# Patient Record
Sex: Male | Born: 1940 | Hispanic: Yes | Smoking: Current every day smoker
Health system: Southern US, Community
[De-identification: ages and names within clinical notes are randomized; demographics above are authoritative.]

## PROBLEM LIST (undated history)

## (undated) DIAGNOSIS — F329 Major depressive disorder, single episode, unspecified: Secondary | ICD-10-CM

## (undated) DIAGNOSIS — F32A Depression, unspecified: Secondary | ICD-10-CM

## (undated) DIAGNOSIS — K2970 Gastritis, unspecified, without bleeding: Secondary | ICD-10-CM

## (undated) DIAGNOSIS — I10 Essential (primary) hypertension: Secondary | ICD-10-CM

## (undated) DIAGNOSIS — I252 Old myocardial infarction: Secondary | ICD-10-CM

## (undated) DIAGNOSIS — Z950 Presence of cardiac pacemaker: Secondary | ICD-10-CM

## (undated) DIAGNOSIS — R55 Syncope and collapse: Secondary | ICD-10-CM

## (undated) DIAGNOSIS — M1990 Unspecified osteoarthritis, unspecified site: Secondary | ICD-10-CM

## (undated) DIAGNOSIS — I442 Atrioventricular block, complete: Secondary | ICD-10-CM

## (undated) DIAGNOSIS — I240 Acute coronary thrombosis not resulting in myocardial infarction: Secondary | ICD-10-CM

## (undated) HISTORY — DX: Essential (primary) hypertension: I10

## (undated) HISTORY — DX: Presence of cardiac pacemaker: Z95.0

## (undated) HISTORY — DX: Atrioventricular block, complete: I44.2

## (undated) HISTORY — DX: Old myocardial infarction: I25.2

## (undated) HISTORY — DX: Depression, unspecified: F32.A

## (undated) HISTORY — DX: Gastritis, unspecified, without bleeding: K29.70

## (undated) HISTORY — DX: Acute coronary thrombosis not resulting in myocardial infarction: I24.0

## (undated) HISTORY — DX: Major depressive disorder, single episode, unspecified: F32.9

## (undated) HISTORY — DX: Syncope and collapse: R55

## (undated) HISTORY — PX: PACEMAKER INSERTION: SHX728

## (undated) HISTORY — DX: Unspecified osteoarthritis, unspecified site: M19.90

---

## 1990-08-18 DIAGNOSIS — I252 Old myocardial infarction: Secondary | ICD-10-CM

## 1990-08-18 HISTORY — DX: Old myocardial infarction: I25.2

## 2003-12-19 ENCOUNTER — Emergency Department (HOSPITAL_COMMUNITY): Admission: EM | Admit: 2003-12-19 | Discharge: 2003-12-20 | Payer: Self-pay | Admitting: Emergency Medicine

## 2003-12-20 ENCOUNTER — Emergency Department (HOSPITAL_COMMUNITY): Admission: EM | Admit: 2003-12-20 | Discharge: 2003-12-20 | Payer: Self-pay | Admitting: Emergency Medicine

## 2004-01-21 ENCOUNTER — Emergency Department (HOSPITAL_COMMUNITY): Admission: EM | Admit: 2004-01-21 | Discharge: 2004-01-21 | Payer: Self-pay | Admitting: Emergency Medicine

## 2004-01-22 ENCOUNTER — Ambulatory Visit (HOSPITAL_COMMUNITY): Admission: RE | Admit: 2004-01-22 | Discharge: 2004-01-22 | Payer: Self-pay | Admitting: *Deleted

## 2004-01-24 ENCOUNTER — Ambulatory Visit (HOSPITAL_COMMUNITY): Admission: RE | Admit: 2004-01-24 | Discharge: 2004-01-24 | Payer: Self-pay | Admitting: *Deleted

## 2004-02-08 ENCOUNTER — Ambulatory Visit (HOSPITAL_COMMUNITY): Admission: RE | Admit: 2004-02-08 | Discharge: 2004-02-08 | Payer: Self-pay | Admitting: *Deleted

## 2004-06-19 ENCOUNTER — Ambulatory Visit: Payer: Self-pay | Admitting: *Deleted

## 2004-09-18 ENCOUNTER — Ambulatory Visit: Payer: Self-pay | Admitting: Internal Medicine

## 2005-06-17 ENCOUNTER — Ambulatory Visit (HOSPITAL_COMMUNITY): Admission: RE | Admit: 2005-06-17 | Discharge: 2005-06-17 | Payer: Self-pay | Admitting: Gastroenterology

## 2005-07-04 ENCOUNTER — Ambulatory Visit: Payer: Self-pay | Admitting: Internal Medicine

## 2006-01-15 ENCOUNTER — Ambulatory Visit: Payer: Self-pay | Admitting: Internal Medicine

## 2006-08-04 ENCOUNTER — Ambulatory Visit: Payer: Self-pay | Admitting: Internal Medicine

## 2007-01-25 ENCOUNTER — Ambulatory Visit: Payer: Self-pay | Admitting: Internal Medicine

## 2007-03-16 ENCOUNTER — Encounter: Admission: RE | Admit: 2007-03-16 | Discharge: 2007-03-16 | Payer: Self-pay | Admitting: Rheumatology

## 2007-04-16 ENCOUNTER — Encounter: Admission: RE | Admit: 2007-04-16 | Discharge: 2007-04-16 | Payer: Self-pay | Admitting: Rheumatology

## 2007-05-10 ENCOUNTER — Encounter: Admission: RE | Admit: 2007-05-10 | Discharge: 2007-05-10 | Payer: Self-pay | Admitting: Neurosurgery

## 2007-05-27 ENCOUNTER — Ambulatory Visit (HOSPITAL_COMMUNITY): Admission: RE | Admit: 2007-05-27 | Discharge: 2007-05-28 | Payer: Self-pay | Admitting: Neurosurgery

## 2007-09-21 ENCOUNTER — Ambulatory Visit: Payer: Self-pay | Admitting: Internal Medicine

## 2008-10-27 ENCOUNTER — Encounter: Payer: Self-pay | Admitting: Internal Medicine

## 2008-10-31 ENCOUNTER — Ambulatory Visit: Payer: Self-pay | Admitting: Internal Medicine

## 2008-10-31 ENCOUNTER — Encounter: Payer: Self-pay | Admitting: Internal Medicine

## 2008-10-31 DIAGNOSIS — I442 Atrioventricular block, complete: Secondary | ICD-10-CM | POA: Insufficient documentation

## 2008-10-31 DIAGNOSIS — I10 Essential (primary) hypertension: Secondary | ICD-10-CM | POA: Insufficient documentation

## 2008-11-20 ENCOUNTER — Emergency Department (HOSPITAL_COMMUNITY): Admission: EM | Admit: 2008-11-20 | Discharge: 2008-11-21 | Payer: Self-pay | Admitting: Emergency Medicine

## 2008-12-05 ENCOUNTER — Encounter: Admission: RE | Admit: 2008-12-05 | Discharge: 2008-12-05 | Payer: Self-pay | Admitting: Neurosurgery

## 2009-10-24 ENCOUNTER — Ambulatory Visit: Payer: Self-pay | Admitting: Internal Medicine

## 2009-10-25 ENCOUNTER — Inpatient Hospital Stay (HOSPITAL_COMMUNITY): Admission: RE | Admit: 2009-10-25 | Discharge: 2009-10-26 | Payer: Self-pay | Admitting: Internal Medicine

## 2009-10-25 ENCOUNTER — Ambulatory Visit: Payer: Self-pay | Admitting: Internal Medicine

## 2009-10-29 ENCOUNTER — Encounter: Payer: Self-pay | Admitting: Internal Medicine

## 2009-11-05 ENCOUNTER — Encounter: Payer: Self-pay | Admitting: Internal Medicine

## 2009-11-05 ENCOUNTER — Ambulatory Visit: Payer: Self-pay

## 2009-11-16 ENCOUNTER — Telehealth (INDEPENDENT_AMBULATORY_CARE_PROVIDER_SITE_OTHER): Payer: Self-pay | Admitting: *Deleted

## 2010-02-12 ENCOUNTER — Ambulatory Visit: Payer: Self-pay | Admitting: Internal Medicine

## 2010-02-12 DIAGNOSIS — Z950 Presence of cardiac pacemaker: Secondary | ICD-10-CM

## 2010-09-15 LAB — CONVERTED CEMR LAB
BUN: 20 mg/dL (ref 6–23)
Basophils Absolute: 0.1 10*3/uL (ref 0.0–0.1)
Basophils Relative: 0.9 % (ref 0.0–3.0)
CO2: 27 meq/L (ref 19–32)
Calcium: 9.3 mg/dL (ref 8.4–10.5)
Chloride: 105 meq/L (ref 96–112)
Creatinine, Ser: 0.9 mg/dL (ref 0.4–1.5)
Eosinophils Absolute: 0.2 10*3/uL (ref 0.0–0.7)
Eosinophils Relative: 2.6 % (ref 0.0–5.0)
GFR calc non Af Amer: 89.61 mL/min (ref >60)
Glucose, Bld: 127 mg/dL — ABNORMAL HIGH (ref 70–99)
HCT: 40 % (ref 39.0–52.0)
Hemoglobin: 13.3 g/dL (ref 13.0–17.0)
INR: 1 (ref 0.8–1.0)
Lymphocytes Relative: 20.7 % (ref 12.0–46.0)
Lymphs Abs: 1.4 10*3/uL (ref 0.7–4.0)
MCHC: 33.3 g/dL (ref 30.0–36.0)
MCV: 102.3 fL — ABNORMAL HIGH (ref 78.0–100.0)
Monocytes Absolute: 0.7 10*3/uL (ref 0.1–1.0)
Monocytes Relative: 11.1 % (ref 3.0–12.0)
Neutro Abs: 4.3 10*3/uL (ref 1.4–7.7)
Neutrophils Relative %: 64.7 % (ref 43.0–77.0)
Platelets: 187 10*3/uL (ref 150.0–400.0)
Potassium: 3.9 meq/L (ref 3.5–5.1)
Prothrombin Time: 10.9 s (ref 9.1–11.7)
RBC: 3.9 M/uL — ABNORMAL LOW (ref 4.22–5.81)
RDW: 12.4 % (ref 11.5–14.6)
Sodium: 140 meq/L (ref 135–145)
WBC: 6.7 10*3/uL (ref 4.5–10.5)
aPTT: 26.5 s (ref 21.7–28.8)

## 2010-09-17 NOTE — Cardiovascular Report (Signed)
Summary: Office Visit   Office Visit   Imported By: Roderic Ovens 11/15/2009 10:40:46  _____________________________________________________________________  External Attachment:    Type:   Image     Comment:   External Document

## 2010-09-17 NOTE — Letter (Signed)
Summary: Implantable Device Instructions  Architectural technologist, Main Office  [address]   [place], [place] [address]   Phone: [phone]  Fax: [phone]      Implantable Device Instructions  You are scheduled for:  __x___ Permanent Transvenous Pacemaker CHANGE OUT WITH LEAD REVISION _____ Implantable Cardioverter Defibrillator _____ Implantable Loop Recorder _____ Generator Change  on 10/25/09 with DR. Ladona Ridgel.  1.  Please arrive at the Short Stay Center at [facility] at 1000 on the day of your procedure.  2.  Do not eat or drink the night before your procedure.  3.  Complete lab work on 10/24/09.  The lab at [facility] is open from 8:30 AM to 1:30 PM and from 2:30 PM to 5:00 PM.  The lab at [facility] is open from 7:30 AM to 5:30 PM.  You do not have to be fasting.  4.  You make take your meds with a small sip of water the morning of your procedure  5.  Plan for an overnight stay.  Bring your insurance cards and a list of your medications.  6.  Wash your chest and neck with antibacterial soap (any brand) the evening before and the morning of your procedure.  Rinse well.    *If you have ANY questions after you get home, please call the office [phone].  *Every attempt is made to prevent procedures from being rescheduled.  Due to the nauture of Electrophysiology, rescheduling can happen.  The physician is always aware and directs the staff when this occurs.

## 2010-09-17 NOTE — Cardiovascular Report (Signed)
Summary: Pre Op Orders  Pre Op Orders   Imported By: Roderic Ovens 11/01/2009 14:22:34  _____________________________________________________________________  External Attachment:    Type:   Image     Comment:   External Document

## 2010-09-17 NOTE — Progress Notes (Signed)
Summary: need rx called in today one pill left  Phone Note Call from Patient Call back at [phone]   Caller: pt Joseph Austin Reason for Call: Refill Medication Summary of Call: pt need HTCZ called in [facility] [address] [phone] Initial call taken by: Faythe Ghee,  November 16, 2009 9:20 AM  Follow-up for Phone Call        faxed to [facility] [address] Follow-up by: Oswald Hillock,  November 16, 2009 2:15 PM    Prescriptions: HYDROCHLOROTHIAZIDE 25 MG TABS (HYDROCHLOROTHIAZIDE) Take one  half tablet by mouth daily.  #30 x 3   Entered by:   Oswald Hillock   Authorized by:   Laren Boom, MD, [facility]   Signed by:   Oswald Hillock on 11/16/2009   Method used:   Electronically to        [id number] (retail)       [address] [place], [place]  [address]       Ph: [phone]       Fax: [phone]   RxID:   [id number]

## 2010-09-17 NOTE — Miscellaneous (Signed)
Summary: Device change out  Clinical Lists Changes  Observations: Added new observation of PPMLEADSTAT3: active (10/29/2009 9:22) Added new observation of PPMLEADSER3: [id number] (10/29/2009 9:22) Added new observation of PPMLEADMOD3: 2088TC (10/29/2009 9:22) Added new observation of PPMLEADLOC3: RV (10/29/2009 9:22) Added new observation of PPMLEADSTAT2: active (10/29/2009 9:22) Added new observation of PPMLEADSER2: [id number] (10/29/2009 9:22) Added new observation of PPMLEADMOD2: 2088TC (10/29/2009 9:22) Added new observation of PPMLEADDOI3: 10/25/2009 (10/29/2009 9:22) Added new observation of PPMLEADDOI2: 10/25/2009 (10/29/2009 9:22) Added new observation of PPMLEADLOC2: RA (10/29/2009 9:22) Added new observation of PPMLEADSTAT1: capped (10/29/2009 9:22) Added new observation of PPM IMP MD: Lewayne Bunting, MD (10/29/2009 9:22) Added new observation of PPM DOI: 10/25/2009 (10/29/2009 9:22) Added new observation of PPM SERL#: [id number]  (10/29/2009 9:22) Added new observation of PPM MODL#: [id number]  (10/29/2009 1:88) Added new observation of MAGNET RTE: 10/25/2009 [employer]letronics [id number] explanted.  (10/29/2009 9:22)      PPM Specifications Following MD:  Lewayne Bunting, MD     PPM Vendor:  [employer]     PPM Model Number:  [id number]     PPM Serial Number:  [id number] PPM DOI:  10/25/2009     PPM Implanting MD:  Lewayne Bunting, MD  Lead 1    Location: RV     DOI: 04/26/1991     Model #: 542-706     Serial #: [id number]     Status: capped Lead 2    Location: RA     DOI: 10/25/2009     Model #: [id number]     Serial #: [id number]     Status: active Lead 3    Location: RV     DOI: 10/25/2009     Model #: [id number]     Serial #: [id number]     Status: active  Magnet Response Rate:  10/25/2009 [employer]ics 938H/[id number] explanted.  Indications:  CHB   PPM Follow Up Pacer Dependent:  Yes

## 2010-09-17 NOTE — Assessment & Plan Note (Signed)
Summary: 1 YR F/U   Visit Type:  Follow-up   History of Present Illness: The patient returns today for followup of his PPM. He has a h/o CHB, and is s/p PPM in [place] in 1992.  He has done quite well.  He has HTN.  He denies c/p, sob or syncope.  No edema.  Current Medications (verified): 1)  Atenolol 100 Mg Tabs (Atenolol) .... Take One Tablet By Mouth Daily 2)  Hydrochlorothiazide 25 Mg Tabs (Hydrochlorothiazide) .... Take One  Half Tablet By Mouth Daily. 3)  Aspirin 81 Mg Tbec (Aspirin) .... Take One Tablet By Mouth Daily  Allergies (verified): No Known Drug Allergies  Past History:  Past Medical History: Last updated: 10/31/2008 1. Complete heart block. 2. Syncope secondary to #1  3.Status post pacemaker insertion  4. Hypertension.  Past Surgical History: Last updated: 10/31/2008 Cervical stenosis with myelopathy.  Review of Systems  The patient denies chest pain, syncope, dyspnea on exertion, and peripheral edema.    Vital Signs:  Patient profile:   70 year old male Height:      71 inches Weight:      203 pounds BMI:     28.42 Pulse rate:   70 / minute BP sitting:   125 / 77  (left arm) Cuff size:   regular  Vitals Entered By: Burnett Kanaris, CNA (October 24, 2009 11:05 AM)  Physical Exam  General:  Well developed, well nourished, in no acute distress.  HEENT: normal Neck: supple. No JVD. Carotids 2+ bilaterally no bruits Cor: RRR no rubs, gallops or murmur Lungs: CTA. Well healed ICD incision. Ab: soft, nontender. nondistended. No HSM. Good bowel sounds Ext: warm. no cyanosis, clubbing or edema Neuro: alert and oriented. Grossly nonfocal. affect pleasant    EKG  Procedure date:  10/24/2009  Findings:      Normal sinus rhythm with rate of:  70/min.  PPM Specifications Following MD:  Lewayne Bunting, MD     PPM Vendor:  [employer] Jude     PPM Model Number:  [id number]     PPM Serial Number:  [id number] PPM DOI:  04/26/1991      Lead 1    Location: RV     DOI:  04/26/1991     Model #: 811-914     Serial #: [id number]     Status: active   Indications:  CHB   PPM Follow Up Remote Check?  No Pacer Dependent:  Yes     Right Ventricle  Threshold: 2.2 V at 0.4 msec Tech Comments:  Checked by industry.  Threshold 2.2.  Output increased 5.0 and the patient now has pocket stimulation.  Device is scheduled for change out. Altha Harm, LPN  October 24, 2128 11:22 AM  MD Comments:  Agree with above.  He will require system revision.  Impression & Recommendations:  Problem # 1:  AV BLOCK, COMPLETE (ICD-426.0) His PPM is now in need of revision.  Despite his batttery voltage being acceptable, he has an elevation of his pacing thresholds and in order to achieve any pacing safety margin, he has pocket stimulation.  I have discussed the risks/benefits/goals and expectations of PPM revision and insetion of a new set of leads and generator and he wishes to proceed. His updated medication list for this problem includes:    Atenolol 100 Mg Tabs (Atenolol) .[name] Kitchen... Take one tablet by mouth daily    Aspirin 81 Mg Tbec (Aspirin) .[name] Kitchen... Take one tablet by mouth daily  Orders: TLB-PTT (85730-PTTL) TLB-PT (Protime) (85610-PTP) TLB-CBC Platelet - w/Differential (85025-CBCD) TLB-BMP (Basic Metabolic Panel-BMET) (80048-METABOL)  Problem # 2:  HYPERTENSION, BENIGN (ICD-401.1) A low sodium diet is recommended. He will continue his current meds. His updated medication list for this problem includes:    Atenolol 100 Mg Tabs (Atenolol) .[name] Kitchen... Take one tablet by mouth daily    Hydrochlorothiazide 25 Mg Tabs (Hydrochlorothiazide) .[name] Kitchen... Take one  half tablet by mouth daily.    Aspirin 81 Mg Tbec (Aspirin) .[name] Kitchen... Take one tablet by mouth daily  Patient Instructions: 1)  Your physician has recommended that you have a pacemaker CHAGED OUT AND LEAD REVISION.  A pacemaker is a small device that is placed under the skin of your chest or abdomen to help control abnormal heart rhythms. This  device uses electrical pulses to prompt the heart to beat at a normal rate. Pacemakers are used to treat heart rhythms that are too slow. Wires (leads) are attached to the pacemaker that goes into the chambers of your heart. This is done in the hospital and usually requires an overnight stay. Please see the instruction sheet given to you today for more information.

## 2010-09-17 NOTE — Procedures (Signed)
Summary: wound check   Current Medications (verified): 1)  Atenolol 50 Mg Tabs (Atenolol) .... Take One Tablet By Mouth Daily 2)  Hydrochlorothiazide 25 Mg Tabs (Hydrochlorothiazide) .... Take One  Half Tablet By Mouth Daily. 3)  Aspirin 81 Mg Tbec (Aspirin) .... Take One Tablet By Mouth Daily  Allergies (verified): No Known Drug Allergies  PPM Specifications Following MD:  Lewayne Bunting, MD     PPM Vendor:  [employer] Jude     PPM Model Number:  [id number]     PPM Serial Number:  [id number] PPM DOI:  10/25/2009     PPM Implanting MD:  Lewayne Bunting, MD  Lead 1    Location: RV     DOI: 04/26/1991     Model #: 119-147     Serial #: [id number]     Status: capped Lead 2    Location: RA     DOI: 10/25/2009     Model #: 1308MV     Serial #: [id number]     Status: active Lead 3    Location: RV     DOI: 10/25/2009     Model #: 2841LK     Serial #: [id number]     Status: active  Magnet Response Rate:  10/25/2009 Teletronics 366Y/[id number] explanted.  Indications:  CHB   PPM Follow Up Remote Check?  No Battery Voltage:  3.05 V     Battery Est. Longevity:  6.8 years     Pacer Dependent:  Yes       PPM Device Measurements [facility]  Amplitude: 5.0 mV, Impedance: 480 ohms, Threshold: 0.75 V at 0.5 msec Right Ventricle  Impedance: 580 ohms, Threshold: 0.625 V at 0.5 msec  Episodes MS Episodes:  0     Percent Mode Switch:  0     Coumadin:  No Atrial Pacing:  95%     Ventricular Pacing:  100%  Parameters Mode:  DDDR     Lower Rate Limit:  60     Upper Rate Limit:  130 Paced AV Delay:  180     Sensed AV Delay:  160 Next Cardiology Appt Due:  01/16/2010 Tech Comments:  No parameter changes.  Steri strips removed, no redness or edema.  ROV 3 months with Dr. Ladona Ridgel. Altha Harm, LPN  November 05, 2009 3:03 PM  MD Comments:  Agree with above.

## 2010-09-17 NOTE — Assessment & Plan Note (Signed)
Summary: DEVICE/PT WILL HAVE INTERPERTER (SPANISH)RASHANDA/SF   Visit Type:  Follow-up   History of Present Illness: The patient returns today for followup of his PPM. He has a h/o CHB, and is s/p PPM in [place] in 1992.  He has done quite well.  He has HTN.  He denies c/p, sob or syncope.  No edema.  He underwent PM generator change and insertion of new RV and RA leads.  No problems since then.    Current Medications (verified): 1)  Atenolol 50 Mg Tabs (Atenolol) .... Take One Tablet By Mouth Daily 2)  Hydrochlorothiazide 25 Mg Tabs (Hydrochlorothiazide) .... Take One  Half Tablet By Mouth Daily. 3)  Allopurinol 300 Mg Tabs (Allopurinol) .... Once Daily  Allergies (verified): No Known Drug Allergies  Past History:  Past Medical History: Last updated: 10/31/2008 1. Complete heart block. 2. Syncope secondary to #1  3.Status post pacemaker insertion  4. Hypertension.  Past Surgical History: Last updated: 10/31/2008 Cervical stenosis with myelopathy.  Review of Systems  The patient denies chest pain, syncope, dyspnea on exertion, and peripheral edema.    Vital Signs:  Patient profile:   70 year old male Height:      71 inches Weight:      204 pounds BMI:     28.56 Pulse rate:   63 / minute BP sitting:   140 / 84  (left arm)  Vitals Entered By: Laurance Flatten CMA (February 12, 2010 2:45 PM)  Physical Exam  General:  Well developed, well nourished, in no acute distress.  HEENT: normal Neck: supple. No JVD. Carotids 2+ bilaterally no bruits Cor: RRR no rubs, gallops or murmur Lungs: CTA. Well healed ICD incision. Ab: soft, nontender. nondistended. No HSM. Good bowel sounds Ext: warm. no cyanosis, clubbing or edema Neuro: alert and oriented. Grossly nonfocal. affect pleasant    PPM Specifications Following MD:  Lewayne Bunting, MD     PPM Vendor:  [employer] Jude     PPM Model Number:  [id number]     PPM Serial Number:  [id number] PPM DOI:  10/25/2009     PPM Implanting MD:  Lewayne Bunting,  MD  Lead 1    Location: RV     DOI: 04/26/1991     Model #: 106-269     Serial #: [id number]     Status: capped Lead 2    Location: RA     DOI: 10/25/2009     Model #: [id number]     Serial #: [id number]     Status: active Lead 3    Location: RV     DOI: 10/25/2009     Model #: [id number]     Serial #: [id number]     Status: active  Magnet Response Rate:  10/25/2009 Teletronics 102H/[id number] explanted.  Indications:  CHB   PPM Follow Up Pacer Dependent:  Yes      Episodes Coumadin:  No  Parameters Mode:  DDDR     Lower Rate Limit:  60     Upper Rate Limit:  130 Paced AV Delay:  180     Sensed AV Delay:  160 MD Comments:  Agree with above.  Impression & Recommendations:  Problem # 1:  CARDIAC PACEMAKER IN SITU (ICD-V45.01) His device is working normally.  Will recheck in 9 months.  Problem # 2:  HYPERTENSION, BENIGN (ICD-401.1) His blood pressure has been reasonably well controlled. Continue meds as below and maintain a low sodium diet. The following medications were removed from  the medication list:    Aspirin 81 Mg Tbec (Aspirin) .[name] Kitchen... Take one tablet by mouth daily    Atenolol 50 Mg Tabs (Atenolol) .[name] Kitchen... Take one tablet by mouth daily His updated medication list for this problem includes:    Hydrochlorothiazide 25 Mg Tabs (Hydrochlorothiazide) .[name] Kitchen... Take one  half tablet by mouth daily.  Patient Instructions: 1)  Your physician recommends that you schedule a follow-up appointment in: 9 month follow up with Dr Ladona Ridgel  2)  Your physician recommends that you continue on your current medications as directed. Please refer to the Current Medication list given to you today.

## 2010-10-25 ENCOUNTER — Encounter: Payer: Self-pay | Admitting: Internal Medicine

## 2010-10-25 ENCOUNTER — Encounter (INDEPENDENT_AMBULATORY_CARE_PROVIDER_SITE_OTHER): Payer: Medicare Other | Admitting: Internal Medicine

## 2010-10-25 DIAGNOSIS — I10 Essential (primary) hypertension: Secondary | ICD-10-CM

## 2010-10-25 DIAGNOSIS — I442 Atrioventricular block, complete: Secondary | ICD-10-CM

## 2010-10-29 NOTE — Assessment & Plan Note (Signed)
Summary: 9 month rov/pacer check   Visit Type:  Device check   History of Present Illness: The patient returns today for followup of his PPM. He has a h/o CHB, and is s/p PPM in [place] in 1992 and he underwent insertion of a new device with a new RA and RV lead several months ago.  He has done quite well.  He has HTN.  He denies c/p, sob or syncope.  No edema.    Current Medications (verified): 1)  Atenolol 50 Mg Tabs (Atenolol) .... Take One Tablet By Mouth Daily 2)  Hydrochlorothiazide 25 Mg Tabs (Hydrochlorothiazide) .... Take One  Half Tablet By Mouth Daily. 3)  Allopurinol 300 Mg Tabs (Allopurinol) .... Once Daily  Allergies (verified): No Known Drug Allergies  Past History:  Past Medical History: Last updated: 10/24/2010 Complete heart block Syncope secondary to #1 Status post pacemaker insertion- [employer] [facility] RF 2210 -2011 Hypertension  Past Surgical History: Last updated: 10/24/2010 Cervical stenosis with myelopathy [employer] [facility] RF 2210   Review of Systems  The patient denies chest pain, syncope, dyspnea on exertion, and peripheral edema.    Vital Signs:  Patient profile:   70 year old male Height:      71 inches Weight:      201 pounds BMI:     28.14 Pulse rate:   60 / minute Pulse rhythm:   regular Resp:     18 per minute BP sitting:   128 / 76  (left arm) Cuff size:   large  Vitals Entered By: Vikki Ports (October 25, 2010 3:51 PM)  Physical Exam  General:  Well developed, well nourished, in no acute distress.  HEENT: normal Neck: supple. No JVD. Carotids 2+ bilaterally no bruits Cor: RRR no rubs, gallops or murmur Lungs: CTA. Well healed ICD incision. Ab: soft, nontender. nondistended. No HSM. Good bowel sounds Ext: warm. no cyanosis, clubbing or edema Neuro: alert and oriented. Grossly nonfocal. affect pleasant    PPM Specifications Following MD:  Lewayne Bunting, MD     PPM Vendor:  [employer] Jude     PPM Model Number:   [id number]     PPM Serial Number:  [id number] PPM DOI:  10/25/2009     PPM Implanting MD:  Lewayne Bunting, MD  Lead 1    Location: RV     DOI: 04/26/1991     Model #: 119-147     Serial #: [id number]     Status: capped Lead 2    Location: RA     DOI: 10/25/2009     Model #: 1308MV     Serial #: [id number]     Status: active Lead 3    Location: RV     DOI: 10/25/2009     Model #: 2841LK     Serial #: [id number]     Status: active  Magnet Response Rate:  10/25/2009 [employer]letronics 366Y/[id number] explanted.  Indications:  CHB   PPM Follow Up Pacer Dependent:  Yes      Episodes Coumadin:  No  Parameters Mode:  DDDR     Lower Rate Limit:  60     Upper Rate Limit:  130 Paced AV Delay:  180     Sensed AV Delay:  160 MD Comments:  normal device function  Impression & Recommendations:  Problem # 1:  CARDIAC PACEMAKER IN SITU (ICD-V45.01) Normal device function. Will recheck in several months.  Problem # 2:  HYPERTENSION, BENIGN (  ICD-401.1) His blood pressure is well controlled. He will continue his current meds. His updated medication list for this problem includes:    Atenolol 50 Mg Tabs (Atenolol) .[name] Kitchen... Take one tablet by mouth daily    Hydrochlorothiazide 25 Mg Tabs (Hydrochlorothiazide) .[name] Kitchen... Take one  half tablet by mouth daily.  Patient Instructions: 1)  Your physician recommends that you continue on your current medications as directed. Please refer to the Current Medication list given to you today. 2)  Your physician wants you to follow-up in:12 months with Dr Court Joy will receive a reminder letter in the mail two months in advance. If you don't receive a letter, please call our office to schedule the follow-up appointment.

## 2010-11-05 NOTE — Cardiovascular Report (Signed)
Summary: Office Visit   Office Visit   Imported By: Roderic Ovens 10/30/2010 11:55:50  _____________________________________________________________________  External Attachment:    Type:   Image     Comment:   External Document

## 2010-11-18 ENCOUNTER — Other Ambulatory Visit: Payer: Self-pay | Admitting: Internal Medicine

## 2010-11-19 NOTE — Telephone Encounter (Signed)
HCTZ refilled. 

## 2010-12-31 NOTE — Assessment & Plan Note (Signed)
[facility]                         ELECTROPHYSIOLOGY OFFICE NOTE   TREMONT, GAVITT                 MRN:          [id number]  DATE:09/21/2007                            DOB:          Apr 28, 1941    HISTORY OF PRESENT ILLNESS:  [name] returns today for follow-  up.  He is a very pleasant 70 year old male with a history of complete  heart block and syncope status post permanent pacemaker insertion.  This  was carried out back in September of 1992.  He returns today for follow-  up.  He has a single chamber Telectronics device which is of course no  longer manufactured.   MEDICATIONS:  1. Atenolol 50 mg a day .  2. HCTZ 25 mg a day.   PHYSICAL EXAMINATION:  GENERAL:  He is a pleasant well-appearing man in  no acute distress.  VITAL SIGNS:  Blood pressure today was 136/87, pulse was 70 and regular,  respirations were 18, weight was 200 pounds.  NECK:  Revealed no jugular distention.  LUNGS:  Clear bilaterally auscultation.  No wheezes, rales or rhonchi  are present.  CARDIAC:  Regular rate and rhythm.  Normal S1-S2.  EXTREMITIES:  Demonstrated no edema.   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  The pacing threshold was 1-1/2 volts at 0.5 milliseconds.  His manual  rate was still at 100 beats per minute.   IMPRESSION:  1. Complete heart block.  2. Syncope secondary to #1.  3. Status post pacemaker insertion.  4. Hypertension.   DISCUSSION:  Mr. Schleyer is doing well.  His pacemaker is working  normally.  The battery still shows no signs of loss or reduction in  function.  Will plan to see him back in a year.     Doylene Canning. Ladona Ridgel, MD  Electronically Signed    GWT/MedQ  DD: 09/21/2007  DT: 09/21/2007  Job #: [id number]

## 2010-12-31 NOTE — Assessment & Plan Note (Signed)
[facility]                         ELECTROPHYSIOLOGY OFFICE NOTE   NAME:MERINODanner, Paulding                        MRN:          [id number]  DATE:01/25/2007                            DOB:          12-19-1940    [name] returns today for follow up of his pacemaker.  He is a  very pleasant Joseph Austin male with complete heart block who is status post  pacemaker initially in 1992.  He returns today for follow up.  He has no  specific complaints, except he is tired of the hot weather.  He denies  chest pain, shortness of breath, nausea or vomiting.   PHYSICAL EXAMINATION:  GENERAL:  He is a pleasant, well-appearing,  middle-aged man in no distress.  VITAL SIGNS:  The blood pressure today was 145/88, pulse 69 and regular,  respirations 18, weight 198 pounds.  NECK:  No jugular venous distention.  LUNGS:  Clear bilaterally to auscultation.  No wheezing, rales, or  rhonchi.  CARDIOVASCULAR:  Regular rate and rhythm.  Normal S1 and S2.  EXTREMITIES:  No edema.   Interrogation of his pacemaker demonstrates a [employer]lectronix single chamber  device.  There were no R waves.  The threshold was 0.8 volts at 0.5 ms.  The battery magnet rate was 98 beats per minute (ERI equals 85).   IMPRESSION:  1. Complete heart block.  2. Status post pacemaker insertion.  3. Hypertension.   DISCUSSION:  [name]'s pacemaker is working normally.  Despite  its being in for 16 years, the battery is still satisfactory.  Will plan  to follow him back in 6 months.     Doylene Canning. Ladona Ridgel, MD  Electronically Signed    GWT/MedQ  DD: 01/25/2007  DT: 01/25/2007  Job #: [id number]

## 2010-12-31 NOTE — Op Note (Signed)
NAME:  Joseph Austin, Joseph Austin        ACCOUNT NO.:  [id number]   MEDICAL RECORD NO.:  [id number]          PATIENT TYPE:  INP   LOCATION:  3172                         FACILITY:  [facility]   PHYSICIAN:  Kathaleen Maser. Pool, M.D.    DATE OF BIRTH:  1940/12/14   DATE OF PROCEDURE:  05/27/2007  DATE OF DISCHARGE:                               OPERATIVE REPORT   PREOPERATIVE DIAGNOSIS:  Cervical stenosis with myelopathy.   POSTOPERATIVE DIAGNOSIS:  Cervical stenosis with myelopathy.   PROCEDURE NOTE:  C3-4, C4-5 and C5-6 anterior cervical diskectomy and  fusion with allograft and anterior plating.   SURGEON:  Kathaleen Maser. Pool, M.D.   ASSISTANT:  Tia Alert, MD   ANESTHESIA:  General.   INDICATIONS:  [name] is a 70 year old male with history of  neck pain and bilateral upper extremity numbness, paresthesias and some  weakness.  Workup demonstrates evidence of marked cervical stenosis  worse at C3-4, C5-6.  The patient has been counseled as to his options.  He decided to proceed with C3-4, C4-5 and C5-6 anterior cervical  diskectomy fusion with allograft and plating for hopeful improvement of  his symptoms.  The patient also has coexistent severe lumbar stenosis  which be addressed at a later time.   OPERATIVE NOTE:  Patient taken to operating room, placed on table in  supine position.  Adequate level anesthesia was achieved, the patient  supine with neck slightly extended held place halter traction.  The  patient anterior cervical region prepped draped sterilely.  A 10 blade  used to make linear skin incision overlying the C4-5 level.  This  carried down sharply to the platysma.  The platysma then divided  vertically and dissection proceeded along the medial border of  sternomastoid muscle and carotid sheath.  Trachea, esophagus were  mobilized tracked towards the left.  Prevertebral fascia stripped off  the anterior spinal column.  Longus coli was elevated bilaterally using  electrocautery.  Deep self-retaining retractor placed.  Intraoperative  fluoroscopy used.  Levels were confirmed.  Disk space at C3-4, C4-5 and  C5-6 incised with 15 blade in rectangular fashion.  A wide disk space  clean-out was achieved using pituitary rongeurs, upward angle and  backward angled Carlen curettes, Kerrison rongeurs and high-speed drill.  All elements of the disk were removed down to level of the posterior  annulus.  Microscope was brought to field, used throughout the remainder  of the diskectomy.  Remaining aspects of annulus and osteophytes removed  using high-speed drill down to the posterior longitudinal ligament.  Posterior longitudinal ligament was elevated and resected piecemeal  fashion using Kerrison rongeurs.  Underlying thecal sac was identified.  Wide central decompression performed by undercutting the bodies of C3-  C4.  Decompression proceeded out each neural foramen.  Wide anterior  foraminotomy then performed along course of the exiting C4 nerve roots  bilaterally.  Procedure then repeated at C4-5 and C5-6.  Thorough  decompression was achieved at all levels.  There is no evidence of  injury to the thecal sac or nerve roots.  Wound was then irrigated with  antibiotic solution.  Gelfoam was  placed topically for hemostasis found  be good.  6 mm Life allograft wedges were then packed into place at C3-  4, C4-5, C5-6.  All grafts were packed into place recessed approximately  1 mm from anterior cortical margin.  A 62 mm Atlantis anterior cervical  plate was then placed over the C3, C4, C5 and C6 levels.  This then  attached under fluoroscopic guidance using 13 mm variable screws, two  each at all levels.  All eight screws given final tightening found be  solid within bone.  Locking screws engaged at all four levels.  Final  images revealed good position bone grafts, hardware, proper operative  level, normal alignment of spine.  Wound then inspected for  hemostasis,  found to be good.  Irrigated one final time then closed typical fashion.  Steri-Strips sterile dressings were applied.  There no complications.  The patient tolerated procedure well and he returns recovery room  postoperatively.           ______________________________  Kathaleen Maser Pool, M.D.     HAP/MEDQ  D:  05/27/2007  T:  05/28/2007  Job:  [id number]

## 2011-01-03 NOTE — Op Note (Signed)
NAME:  CARLOSDANIEL, GROB NO.:  [id number]   MEDICAL RECORD NO.:  [id number]          PATIENT TYPE:  AMB   LOCATION:  ENDO                         FACILITY:  [facility]   PHYSICIAN:  Graylin Shiver, M.D.   DATE OF BIRTH:  1941-04-20   DATE OF PROCEDURE:  06/17/2005  DATE OF DISCHARGE:                                 OPERATIVE REPORT   INDICATIONS:  Screening.  Informed consent was obtained after explanation of  the risks of bleeding, infection and perforation.   PREMEDICATION:  Fentanyl 75 mcg IV, Versed 7.5 mg  IV.   PROCEDURE:  With the patient in the left lateral decubitus position, a  rectal exam was performed. No masses were felt. The Olympus colonoscope was  inserted into the rectum and advanced around the colon to the cecum. Cecal  landmarks were identified. The cecum and ascending colon were normal. The  transverse colon normal. The descending colon, sigmoid and rectum were  normal. He tolerated the procedure well without complications.   IMPRESSION:  Normal colonoscopy to the cecum.           ______________________________  Graylin Shiver, M.D.     SFG/MEDQ  D:  06/17/2005  T:  06/17/2005  Job:  [id number]   cc:   Louanna Raw  Fax: [phone]

## 2011-01-03 NOTE — Assessment & Plan Note (Signed)
[facility]                         ELECTROPHYSIOLOGY OFFICE NOTE   Joseph Austin, Joseph Austin                 MRN:          [id number]  DATE:08/04/2006                            DOB:          11/05/1940    Joseph Austin returns today for followup.  He is a very pleasant  middle-aged man with complete heart block who is status post Te[employer]ronics  pacemaker insertion back in 1992.  He returns today for followup.  He  has done well since then and continues to do well without chest pain or  shortness of breath, he denies syncope.   On exam today he is a pleasant, well-appearing, Hispanic man in no acute  distress.  The blood pressure was 115/82, the pulse 70 and regular,  respirations were 18.  His weight was 198 pounds.  NECK:  Revealed no jugular venous distention.  LUNGS:  Clear bilaterally to auscultation.  CARDIOVASCULAR EXAM:  Revealed a regular rate and rhythm with normal S1  and S2.  EXTREMITIES:  Demonstrate no edema.   Interrogation of his pacemaker demonstrates a Telectronics Model 15HC  device with  pacing threshold of 0.5 at 0.5 and the rate set at 70  b.p.m.  There were no other diagnostics.  His magnet rate today was 95  b.p.m., ERI being at 85 per minute.   IMPRESSION:  1. Complete heart block.  2. Status post pacemaker insertion.   DISCUSSION:  Overall, Joseph Austin is stable and his pacemaker is  working normally.  Will see him  back for pacemaker check in 6 months.     Doylene Canning. Ladona Ridgel, MD  Electronically Signed    GWT/MedQ  DD: 08/04/2006  DT: 08/04/2006  Job #: [id number]

## 2011-01-24 ENCOUNTER — Other Ambulatory Visit: Payer: Self-pay | Admitting: Internal Medicine

## 2011-03-21 ENCOUNTER — Other Ambulatory Visit: Payer: Self-pay | Admitting: Internal Medicine

## 2011-04-07 ENCOUNTER — Emergency Department (HOSPITAL_COMMUNITY)
Admission: EM | Admit: 2011-04-07 | Discharge: 2011-04-08 | Disposition: A | Discharge status: Home / Self Care | Payer: Medicare Other | Attending: Emergency Medicine | Admitting: Emergency Medicine

## 2011-04-07 DIAGNOSIS — I10 Essential (primary) hypertension: Secondary | ICD-10-CM | POA: Insufficient documentation

## 2011-04-07 DIAGNOSIS — Z950 Presence of cardiac pacemaker: Secondary | ICD-10-CM | POA: Insufficient documentation

## 2011-04-08 ENCOUNTER — Telehealth: Payer: Self-pay | Admitting: Internal Medicine

## 2011-04-08 LAB — POCT I-STAT, CHEM 8
BUN: 20 mg/dL (ref 6–23)
Calcium, Ion: 1.2 mmol/L (ref 1.12–1.32)
Glucose, Bld: 106 mg/dL — ABNORMAL HIGH (ref 70–99)
HCT: 42 % (ref 39.0–52.0)
Sodium: 136 mEq/L (ref 135–145)
TCO2: 26 mmol/L (ref 0–100)

## 2011-04-08 MED ORDER — CARVEDILOL 12.5 MG PO TABS: 12.5000 mg | ORAL_TABLET | Freq: Two times a day (BID) | ORAL | Status: DC

## 2011-04-08 NOTE — Telephone Encounter (Signed)
Discussed with Dr Ladona Ridgel  Pt to stop Atenolol and start Carvedilol 12.5mg  bid Call us in one week and report BP

## 2011-04-08 NOTE — Telephone Encounter (Signed)
Pt BP is high and pt wants to know should he be checked out to see if his medication needs to be altered. Please return pt call to advise/discuss.

## 2011-04-09 ENCOUNTER — Telehealth: Payer: Self-pay | Admitting: Internal Medicine

## 2011-04-09 NOTE — Telephone Encounter (Signed)
Joseph Austin has question regarding stop blood pressure medication  An starts taken new medication .

## 2011-04-09 NOTE — Telephone Encounter (Signed)
Called [facility] and advised that patient needs to stay on HCTZ.

## 2011-04-20 IMAGING — CT CT L SPINE W/O CM
1 of 12 series · 2 of 20 positions shown, 3 images · non-contrast
Comparison: CT lumbar spine 05/10/2007.

CLINICAL DATA: Low back and bilateral leg pain.

CT LUMBAR SPINE WITHOUT CONTRAST
TECHNIQUE: Multidetector CT imaging of the lumbar spine was
performed without intravenous contrast administration. Multiplanar
CT image reconstructions were also generated.

[Series 4: detail windows · axial · 0.27mm/px · z∈[-244,-174]mm · 2 of 86 slices shown, 3 images]
[im 29/86  soft-tissue]
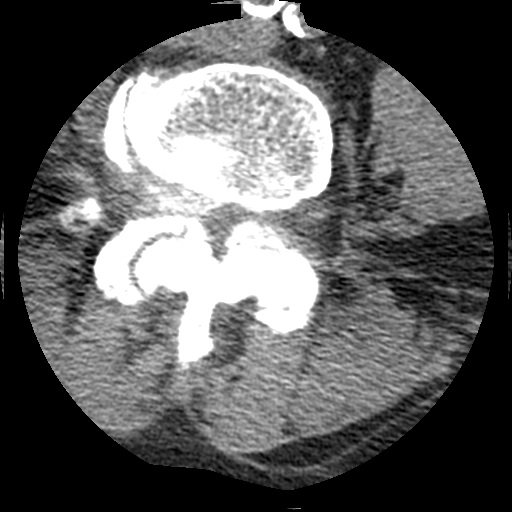
[im 29/86  bone]
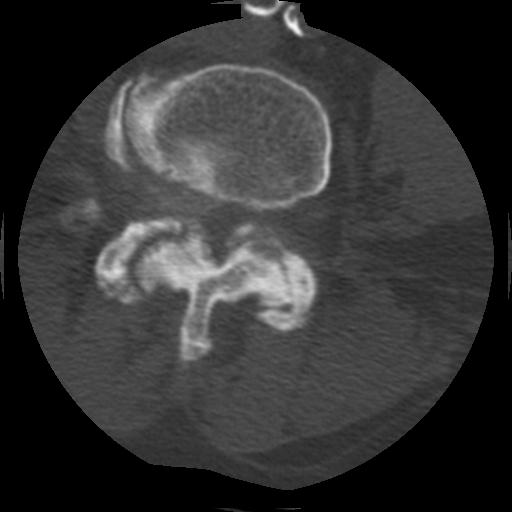
[im 57/86  bone]
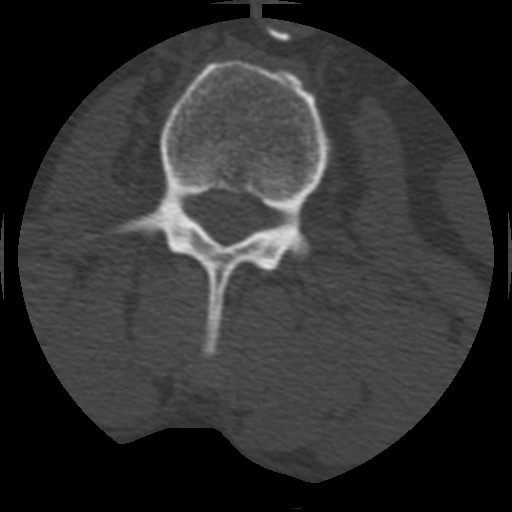

[2 of 20 positions shown; findings below may reference images not displayed]

FINDINGS: The patient has transitional anatomy at the lumbosacral
junction with preserved disc material at the S1-2 level and partial
lumbarization on the right.  The numbering scheme employed on
today's examination is the same as that use on the prior study with
the lowest level imaged in the axial plane labeled as S1-2.

Vertebral body height is maintained.  There is mild anterolisthesis
of L5 on S1 of 0.3 cm due to facet arthropathy, unchanged. Convex
left scoliosis with the apex at L3-4 is noted. Vertebral body is
otherwise normal.  Imaged intra-abdominal contents are
unremarkable.

L1-2:  There is facet arthropathy and slight disc bulging.  Central
canal and foramina appear open.  Appearance is unchanged.

L2-3:  Mild broad-based disc bulge with some calcification within
the annulus posteriorly.  Central canal appears mildly narrowed.
Foramina are open.  There is some mild facet degenerative change.

L3-4:  Broad-based disc bulging and ligamentum flavum thickening
cause moderate central canal stenosis.  The appearance is not
markedly changed.  Neural foramina are mildly narrowed.

L4-5:  Patient has moderate facet degenerative change and there is
a broad-based disc bulge with calcification of annulus.  Bilateral
facet arthropathy is noted.  Moderate to moderately severe central
canal stenosis is present and does not appear markedly changed.
Foramina are moderately narrowed bilaterally.

L5-S1:  Bulky facet arthropathy with ligamentum flavum thickening
and a diffuse broad-based disc bulging disc uncovering.  There is
severe central canal stenosis which is not markedly changed.  Right
foramen appears markedly narrowed.  Mild left foraminal narrowing
noted.

S1-2:  Negative.
IMPRESSION: 1.  Transitional anatomy at the lumbosacral junction.  Please see
numbering scheme above.
2.  No marked change in severe central canal stenosis at L5-S1.
There is also marked right foraminal narrowing at this level.

3.  No change in moderate to moderately severe central canal
stenosis at L4-5.
3.  Unchanged moderate central canal stenosis L3-4.

## 2011-05-29 LAB — BASIC METABOLIC PANEL
BUN: 15
Calcium: 9.6
GFR calc non Af Amer: >60
Glucose, Bld: 104 — ABNORMAL HIGH
Sodium: 137

## 2011-05-29 LAB — DIFFERENTIAL
Basophils Absolute: 0.1
Eosinophils Absolute: 0.3
Lymphs Abs: 1.9
Monocytes Absolute: 0.8 — ABNORMAL HIGH
Monocytes Relative: 10

## 2011-05-29 LAB — TYPE AND SCREEN
ABO/RH(D): O POS
Antibody Screen: NEGATIVE

## 2011-05-29 LAB — CBC
HCT: 43.4
RBC: 4.38

## 2011-06-19 ENCOUNTER — Other Ambulatory Visit: Payer: Self-pay | Admitting: Specialist

## 2011-06-19 DIAGNOSIS — S0990XA Unspecified injury of head, initial encounter: Secondary | ICD-10-CM

## 2011-06-19 DIAGNOSIS — R51 Headache: Secondary | ICD-10-CM

## 2011-06-23 ENCOUNTER — Other Ambulatory Visit: Payer: Self-pay | Admitting: Specialist

## 2011-06-23 DIAGNOSIS — R51 Headache: Secondary | ICD-10-CM

## 2011-06-25 ENCOUNTER — Ambulatory Visit
Admission: RE | Admit: 2011-06-25 | Discharge: 2011-06-25 | Disposition: A | Discharge status: Home / Self Care | Payer: Medicare Other | Source: Ambulatory Visit | Attending: Specialist | Admitting: Specialist

## 2011-06-25 ENCOUNTER — Other Ambulatory Visit: Payer: Medicare Other

## 2011-06-25 DIAGNOSIS — R51 Headache: Secondary | ICD-10-CM

## 2011-07-08 ENCOUNTER — Other Ambulatory Visit: Payer: Self-pay | Admitting: Internal Medicine

## 2011-07-08 NOTE — Telephone Encounter (Signed)
Interpreter calling to check on status of refill request. Pt only has one pill left. Please return pt interpreter call to discuss further.

## 2011-07-11 ENCOUNTER — Other Ambulatory Visit: Payer: Self-pay | Admitting: Internal Medicine

## 2011-07-12 ENCOUNTER — Other Ambulatory Visit: Payer: Self-pay | Admitting: Internal Medicine

## 2011-07-15 ENCOUNTER — Other Ambulatory Visit: Payer: Self-pay

## 2011-07-15 MED ORDER — ATENOLOL 50 MG PO TABS: 50.0000 mg | ORAL_TABLET | Freq: Every day | ORAL | Status: DC

## 2011-10-23 ENCOUNTER — Encounter: Payer: Self-pay | Admitting: Gastroenterology

## 2011-11-17 ENCOUNTER — Ambulatory Visit (INDEPENDENT_AMBULATORY_CARE_PROVIDER_SITE_OTHER): Payer: Medicare Other | Admitting: Gastroenterology

## 2011-11-17 ENCOUNTER — Encounter: Payer: Self-pay | Admitting: Gastroenterology

## 2011-11-17 VITALS — BP 132/64 | HR 80 | Ht 71.0 in | Wt 197.0 lb

## 2011-11-17 DIAGNOSIS — K2970 Gastritis, unspecified, without bleeding: Secondary | ICD-10-CM

## 2011-11-17 DIAGNOSIS — K2990 Gastroduodenitis, unspecified, without bleeding: Secondary | ICD-10-CM

## 2011-11-17 NOTE — Patient Instructions (Signed)
Follow up as needed We will obtain your records from Dr Elnoria Howard

## 2011-11-17 NOTE — Assessment & Plan Note (Addendum)
As far as I can tell I do not think that he has active GI issues. He has had colonoscopy and upper endoscopy.   He reports only mild, occasional upper GI discomfort.  Recommendations #1 continue Protonix. No GI workup at this time.

## 2011-11-17 NOTE — Progress Notes (Signed)
History of Present Illness: [name] is a 71 year old Hispanic male self-referred for GI evaluation. History was provided through an interpreter. He apparently underwent upper endoscopy several months ago and was told to have gastritis. He takes protonix. He has occasional upper abdominal discomfort which he says is very mild. When he awakens in the morning his mouth is dry. He denies dysphagia. He has mild constipation. He believes he has undergone colonoscopy in the past. I am unable to elicit other GI complaints. He complains of weakness.    Past Medical History  Diagnosis Date  . Blockage of coronary artery of heart   . Pacemaker   . Unspecified essential hypertension   . History of heart attack 1992  . Arthritis   . Glaucoma   . Gastritis     Hx of    Past Surgical History  Procedure Date  . Pacemaker insertion    family history is negative for Colon cancer. Current Outpatient Prescriptions  Medication Sig Dispense Refill  . allopurinol (ZYLOPRIM) 300 MG tablet Take 300 mg by mouth daily.      [name] Kitchen atenolol (TENORMIN) 50 MG tablet TAKE 1 TABLET BY MOUTH DAILY  90 tablet  0  . atenolol (TENORMIN) 50 MG tablet Take 1 tablet (50 mg total) by mouth daily.  90 tablet  11  . carvedilol (COREG) 12.5 MG tablet Take 1 tablet (12.5 mg total) by mouth 2 (two) times daily.  30 tablet  11  . hydrochlorothiazide (HYDRODIURIL) 25 MG tablet TAKE  1/2 TABLET BY MOUTH EVERY DAY  45 tablet  2  . pantoprazole (PROTONIX) 40 MG tablet Take 40 mg by mouth daily.       Allergies as of 11/17/2011  . (No Known Allergies)    reports that he has been smoking.  He has never used smokeless tobacco. He reports that he drinks alcohol. He reports that he does not use illicit drugs.     Review of Systems: Pertinent positive and negative review of systems were noted in the above HPI section. All other review of systems were otherwise negative.  Vital signs were reviewed in today's medical  record Physical Exam: General: Well developed , well nourished, no acute distress Head: Normocephalic and atraumatic Eyes:  sclerae anicteric, EOMI Ears: Normal auditory acuity Mouth: No deformity or lesions Neck: Supple, no masses or thyromegaly Lungs: Clear throughout to auscultation Heart: Regular rate and rhythm; no murmurs, rubs or bruits Abdomen: Soft, non tender and non distended. No masses, hepatosplenomegaly or hernias noted. Normal Bowel sounds Rectal:deferred Musculoskeletal: Symmetrical with no gross deformities  Skin: No lesions on visible extremities Pulses:  Normal pulses noted Extremities: No clubbing, cyanosis, edema or deformities noted Neurological: Alert oriented x 4, grossly nonfocal Cervical Nodes:  No significant cervical adenopathy Inguinal Nodes: No significant inguinal adenopathy Psychological:  Alert and cooperative. Normal mood and affect

## 2011-12-24 ENCOUNTER — Ambulatory Visit (INDEPENDENT_AMBULATORY_CARE_PROVIDER_SITE_OTHER): Payer: Medicare Other | Admitting: Internal Medicine

## 2011-12-24 ENCOUNTER — Encounter: Payer: Self-pay | Admitting: Internal Medicine

## 2011-12-24 ENCOUNTER — Encounter: Payer: Self-pay | Admitting: *Deleted

## 2011-12-24 VITALS — BP 146/86 | HR 60 | Ht 69.0 in | Wt 198.1 lb

## 2011-12-24 DIAGNOSIS — I442 Atrioventricular block, complete: Secondary | ICD-10-CM

## 2011-12-24 DIAGNOSIS — Z950 Presence of cardiac pacemaker: Secondary | ICD-10-CM

## 2011-12-24 DIAGNOSIS — I10 Essential (primary) hypertension: Secondary | ICD-10-CM

## 2011-12-24 LAB — PACEMAKER DEVICE OBSERVATION
BAMS-0001: 180 {beats}/min
RV LEAD IMPEDENCE PM: 412.5 Ohm

## 2011-12-24 NOTE — Assessment & Plan Note (Signed)
His blood pressure is relatively well controlled. He will continue his current medications and maintain a low-sodium diet. He is currently taking the carvedilol but not the atenolol.

## 2011-12-24 NOTE — Patient Instructions (Signed)
Your physician wants you to follow-up in: 6 months in the device clinic and 12 months with Dr Taylor You will receive a reminder letter in the mail two months in advance. If you don't receive a letter, please call our office to schedule the follow-up appointment.  

## 2011-12-24 NOTE — Progress Notes (Signed)
HPI [name] returns today for followup. He is a pleasant 71 yo man with a h/o CHB, HTN, s/p PPM insertion. He underwent PPM generator change over a year ago. The patient has been sad over the unexpected death of his wife over a year ago. He denies weight loss. No chest pain or sob or peripheral edema. No Known Allergies   Current Outpatient Prescriptions  Medication Sig Dispense Refill  . allopurinol (ZYLOPRIM) 300 MG tablet Take 300 mg by mouth daily.      [name] Kitchen atenolol (TENORMIN) 50 MG tablet TAKE 1 TABLET BY MOUTH DAILY  90 tablet  0  . carvedilol (COREG) 12.5 MG tablet Take 1 tablet (12.5 mg total) by mouth 2 (two) times daily.  30 tablet  11  . hydrochlorothiazide (HYDRODIURIL) 25 MG tablet TAKE  1/2 TABLET BY MOUTH EVERY DAY  45 tablet  2  . pantoprazole (PROTONIX) 40 MG tablet Take 40 mg by mouth daily.      [name] Kitchen DISCONTD: atenolol (TENORMIN) 50 MG tablet Take 1 tablet (50 mg total) by mouth daily.  90 tablet  11     Past Medical History  Diagnosis Date  . Blockage of coronary artery of heart   . Pacemaker   . Unspecified essential hypertension   . History of heart attack 1992  . Arthritis   . Glaucoma   . Gastritis     Hx of   . HTN (hypertension)   . Syncope   . Complete heart block     ROS:   All systems reviewed and negative except as noted in the HPI.   Past Surgical History  Procedure Date  . Pacemaker insertion      Family History  Problem Relation Age of Onset  . Colon cancer Neg Hx      History   Social History  . Marital Status: Widowed    Spouse Name: N/A    Number of Children: 1  . Years of Education: N/A   Occupational History  . Not on file.   Social History Main Topics  . Smoking status: Current Everyday Smoker  . Smokeless tobacco: Never Used  . Alcohol Use: Yes     Little usage  . Drug Use: No  . Sexually Active: Not on file   Other Topics Concern  . Not on file   Social History Narrative  . No narrative on file      BP 146/86  Pulse 60  Ht 5\' 9"  (1.753 m)  Wt 198 lb 1.9 oz (89.867 kg)  BMI 29.26 kg/m2  Physical Exam:  Sad appearing NAD HEENT: Unremarkable Neck:  No JVD, no thyromegally Lymphatics:  No adenopathy Back:  No CVA tenderness Lungs:  Clear with no wheezes. Well healed PPM incision. HEART:  Regular rate rhythm, no murmurs, no rubs, no clicks Abd:  soft, positive bowel sounds, no organomegally, no rebound, no guarding Ext:  2 plus pulses, no edema, no cyanosis, no clubbing Skin:  No rashes no nodules Neuro:  CN II through XII intact, motor grossly intact  DEVICE  Normal device function.  See PaceArt for details.   Assess/Plan:

## 2011-12-24 NOTE — Assessment & Plan Note (Signed)
His this is working normally. We'll plan to recheck in several months.

## 2011-12-30 ENCOUNTER — Telehealth: Payer: Self-pay | Admitting: Gastroenterology

## 2011-12-30 NOTE — Telephone Encounter (Signed)
Pt has been having abdominal pain on his right side and is requesting to be seen. Pt scheduled to see Mike Gip PA tomorrow at 2pm. Pt aware of appt date and time.

## 2011-12-31 ENCOUNTER — Other Ambulatory Visit (INDEPENDENT_AMBULATORY_CARE_PROVIDER_SITE_OTHER): Payer: Medicare Other

## 2011-12-31 ENCOUNTER — Ambulatory Visit (INDEPENDENT_AMBULATORY_CARE_PROVIDER_SITE_OTHER): Payer: Medicare Other | Admitting: Physician Assistant

## 2011-12-31 ENCOUNTER — Encounter: Payer: Self-pay | Admitting: Physician Assistant

## 2011-12-31 VITALS — BP 132/60 | HR 76 | Ht 69.0 in | Wt 198.0 lb

## 2011-12-31 DIAGNOSIS — R1010 Upper abdominal pain, unspecified: Secondary | ICD-10-CM

## 2011-12-31 DIAGNOSIS — R1011 Right upper quadrant pain: Secondary | ICD-10-CM

## 2011-12-31 DIAGNOSIS — R109 Unspecified abdominal pain: Secondary | ICD-10-CM

## 2011-12-31 LAB — CBC WITH DIFFERENTIAL/PLATELET
Basophils Absolute: 0 10*3/uL (ref 0.0–0.1)
Basophils Relative: 0.5 % (ref 0.0–3.0)
Eosinophils Absolute: 0.2 10*3/uL (ref 0.0–0.7)
Lymphocytes Relative: 20.2 % (ref 12.0–46.0)
MCHC: 33 g/dL (ref 30.0–36.0)
MCV: 101 fl — ABNORMAL HIGH (ref 78.0–100.0)
Monocytes Absolute: 0.7 10*3/uL (ref 0.1–1.0)
Neutrophils Relative %: 66.7 % (ref 43.0–77.0)
Platelets: 193 10*3/uL (ref 150.0–400.0)
RDW: 13.9 % (ref 11.5–14.6)

## 2011-12-31 LAB — COMPREHENSIVE METABOLIC PANEL
ALT: 28 U/L (ref 0–53)
AST: 27 U/L (ref 0–37)
Albumin: 4.3 g/dL (ref 3.5–5.2)
Alkaline Phosphatase: 41 U/L (ref 39–117)
BUN: 16 mg/dL (ref 6–23)
Potassium: 4.3 mEq/L (ref 3.5–5.1)
Sodium: 139 mEq/L (ref 135–145)

## 2011-12-31 MED ORDER — OMEPRAZOLE MAGNESIUM 20 MG PO TBEC: 20.0000 mg | DELAYED_RELEASE_TABLET | Freq: Every day | ORAL | Status: DC

## 2011-12-31 NOTE — Progress Notes (Signed)
Reviewed and agree with management. Tykia Mellone D. Kamylah Manzo, M.D., FACG  

## 2011-12-31 NOTE — Patient Instructions (Signed)
We have given you samples of Prilosec OTC. We scheduled the Ultrasound for tomorrow 01-01-2012. Arrive to the Radiology deparment, 1st floor by 8:15 Am.  Do not have anything to eat or drink after midnight.  We will have an interpreter there for you.

## 2011-12-31 NOTE — Progress Notes (Signed)
Subjective:    Patient ID: Joseph Austin, male    DOB: June 07, 1941, 71 y.o.   MRN: [id number]  HPI Abdirahman is a 71 year old Hispanic male who does not speak [place]. He is accompanied by a friend and an interpreter was brought in as well. He had seen Dr. Arlyce Dice  on one prior occasion in April of 2013 and at that time was complaining of mild upper abdominal discomfort. It was not felt that he was having any significant GI problems and he was asked to continue on Protonix 40 mg by mouth daily.  Review of his records show that he did have an upper endoscopy done by Dr. Jeani Hawking; February 2011 showed question of candida esophagitis and was otherwise negative.  Patient comes in today stating that he has been taking the Protonix regularly but does not feel that it helps. He says he really was not having any significant abdominal discomfort until 2 days ago at which time he describes an attack of severe sharp right upper quadrant pain which was colicky and lasted off and on all day. He apparently had some similar fairly intense pain yesterday and feels better today. He says he has never had pain similar to this in the past and it was very intense. He did have not have any associated nausea or vomiting no fever or chills, he says he felt fatigued all over her during this episode. His appetite has been fine he has not had any significant weight loss denies any dysphagia or odynophagia. He has not had any change in his bowel habits but does have some occasional constipation. He denies any melena or hematochezia.  Patient is not on any regular aspirin or NSAIDs and does not drink any alcohol regularly. He has not had any prior abdominal surgery. He is status post pacemaker placement for third degree heart block.    Review of Systems  Constitutional: Positive for fatigue.  HENT: Negative.   Eyes: Negative.   Respiratory: Negative.   Cardiovascular: Negative.   Gastrointestinal: Positive for  abdominal pain.  Genitourinary: Negative.   Musculoskeletal: Negative.   Neurological: Negative.   Hematological: Negative.   Psychiatric/Behavioral: Negative.    Outpatient Prescriptions Prior to Visit  Medication Sig Dispense Refill  . allopurinol (ZYLOPRIM) 300 MG tablet Take 300 mg by mouth daily.      [name] Kitchen atenolol (TENORMIN) 50 MG tablet TAKE 1 TABLET BY MOUTH DAILY  90 tablet  0  . carvedilol (COREG) 12.5 MG tablet Take 1 tablet (12.5 mg total) by mouth 2 (two) times daily.  30 tablet  11  . hydrochlorothiazide (HYDRODIURIL) 25 MG tablet TAKE  1/2 TABLET BY MOUTH EVERY DAY  45 tablet  2  . pantoprazole (PROTONIX) 40 MG tablet Take 40 mg by mouth daily.        No Known Allergies     Patient Active Problem List  Diagnoses  . HYPERTENSION, BENIGN  . AV BLOCK, COMPLETE  . CARDIAC PACEMAKER IN SITU  . Unspecified gastritis and gastroduodenitis without mention of hemorrhage   History   Social History  . Marital Status: Widowed    Spouse Name: N/A    Number of Children: 1  . Years of Education: N/A   Occupational History  . Not on file.   Social History Main Topics  . Smoking status: Current Everyday Smoker  . Smokeless tobacco: Never Used  . Alcohol Use: Yes     Little usage  . Drug Use: No  . Sexually  Active: Not on file   Other Topics Concern  . Not on file   Social History Narrative  . No narrative on file     Objective:   Physical Exam well-developed older Hispanic male in no acute distress, accompanied by a friend, interpreter also in the room blood pressure 132/60 pulse 76 height 5 foot 9 weight 198. HEENT; nontraumatic normocephalic EOMI PERRLA sclera anicteric conjunctiva pink, Neck; supple no JVD, Cardiovascular; regular rate and rhythm with S1-S2 no murmur or gallop he does have a pacemaker in the right upper chest wall. Pulmonary; few scattered rhonchi bilaterally., Abdomen; soft bowel sounds are active no palpable mass or hepatosplenomegaly, no fluid  wave appreciated, is very minimally tender in the epigastrium and right upper quadrant, Rectal; exams not done, Extremities; no clubbing cyanosis or edema skin warm and dry, Psych; mood and affect appropriate        Assessment & Plan:  #51 71 year old Hispanic male with complaints of severe colicky right upper quadrant pain onset about 48 hours ago and intermittent through yesterday, improved today. Will need to rule out biliary colic, cholecystitis, ulcer disease.  Plan; Patient would like to try omeprazole, this is fine he was given samples of Prilosec 20 mg by mouth daily and also a prescription for Prilosec 20 mg by mouth daily. Check CBC hepatic panel and lipase today. Schedule for upper abdominal ultrasound tomorrow. If above workup is unrevealing, he will likely need repeat EGD, and may also need colonoscopy.

## 2012-01-01 ENCOUNTER — Ambulatory Visit (HOSPITAL_COMMUNITY)
Admission: RE | Admit: 2012-01-01 | Discharge: 2012-01-01 | Disposition: A | Discharge status: Home / Self Care | Payer: Medicare Other | Source: Ambulatory Visit | Attending: Physician Assistant | Admitting: Physician Assistant

## 2012-01-01 DIAGNOSIS — R1010 Upper abdominal pain, unspecified: Secondary | ICD-10-CM

## 2012-01-01 DIAGNOSIS — R109 Unspecified abdominal pain: Secondary | ICD-10-CM | POA: Insufficient documentation

## 2012-01-19 ENCOUNTER — Other Ambulatory Visit: Payer: Self-pay | Admitting: Internal Medicine

## 2012-03-10 IMAGING — CR Imaging study
1 series · 1 of 1 positions shown · non-contrast
Comparison: 10/25/2009

CLINICAL DATA: Placed implantable device

PORTABLE CHEST - 1 VIEW

[view not recorded]
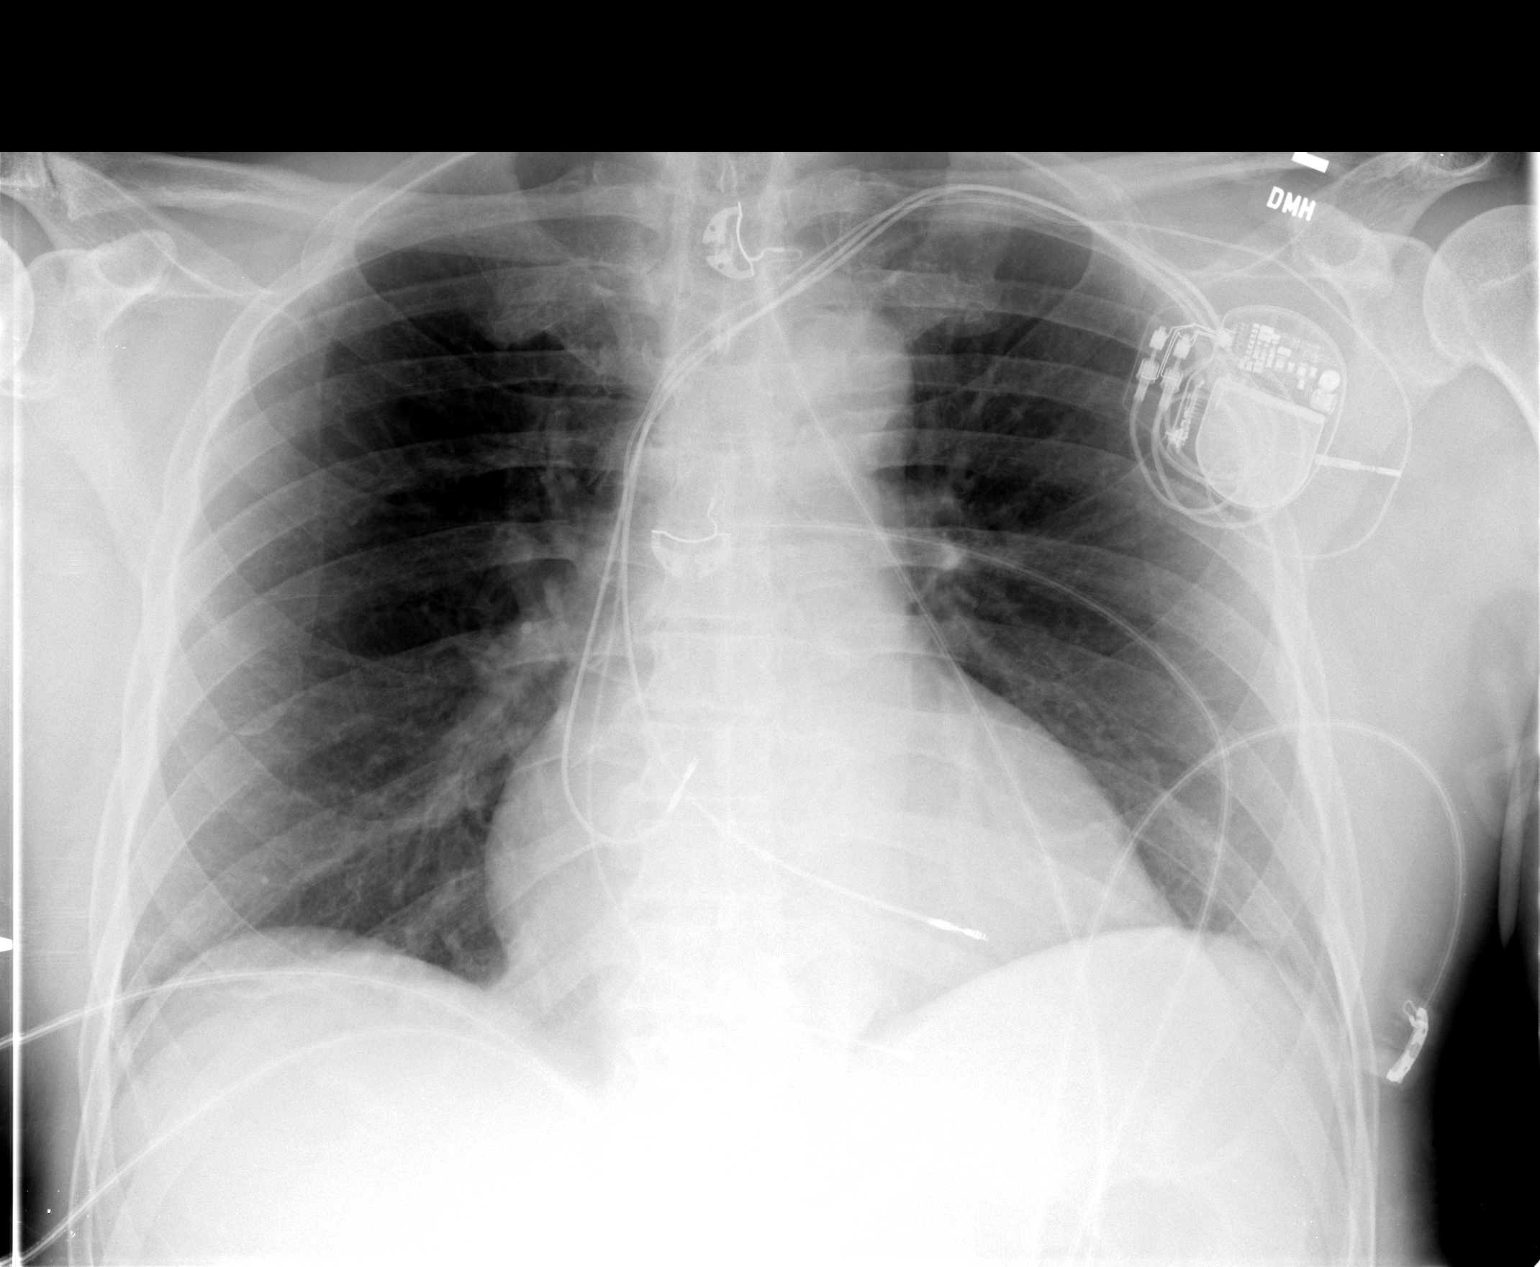

[1 of 1 positions shown; findings below may reference images not displayed]

FINDINGS: There is a left chest wall pacer device with leads in the
right atrial appendage and right ventricle.

Heart size appears normal.

No pleural effusion or pulmonary edema.

No pneumothorax is visible.
IMPRESSION: 1.  No complicating features identified status post pacer
placement.

## 2012-06-08 ENCOUNTER — Other Ambulatory Visit: Payer: Self-pay | Admitting: Internal Medicine

## 2012-06-10 ENCOUNTER — Other Ambulatory Visit: Payer: Self-pay | Admitting: Internal Medicine

## 2012-08-04 ENCOUNTER — Encounter: Payer: Self-pay | Admitting: Gastroenterology

## 2012-08-04 ENCOUNTER — Ambulatory Visit (INDEPENDENT_AMBULATORY_CARE_PROVIDER_SITE_OTHER): Payer: Medicare Other | Admitting: Gastroenterology

## 2012-08-04 VITALS — BP 144/68 | HR 60 | Ht 67.75 in | Wt 200.4 lb

## 2012-08-04 DIAGNOSIS — K2990 Gastroduodenitis, unspecified, without bleeding: Secondary | ICD-10-CM

## 2012-08-04 MED ORDER — HYOSCYAMINE SULFATE 0.125 MG SL SUBL: 0.2500 mg | SUBLINGUAL_TABLET | SUBLINGUAL | Status: DC | PRN

## 2012-08-04 NOTE — Patient Instructions (Addendum)
You have been given a separate informational sheet regarding your tobacco use, the importance of quitting and local resources to help you quit.  Prescription being sent to your pharmacy for you to pick up

## 2012-08-04 NOTE — Progress Notes (Signed)
History of Present Illness:  The patient has returned for followup of abdominal pain. Intermittently he has upper abdominal pain. Pain is without radiation. He also has described pain in the right and left lower quadrants. Pain is usually spontaneous and may last for less than an hour. It is of variable intensity. He underwent upper endoscopy in 2011 for similar complaints. Exam was normal. Apparently he had a colonoscopy in the last 4-5 years which was unremarkable. He denies melena or hematochezia. History was obtained through an interpreter.    Review of Systems: Pertinent positive and negative review of systems were noted in the above HPI section. All other review of systems were otherwise negative.    Current Medications, Allergies, Past Medical History, Past Surgical History, Family History and Social History were reviewed in [employer] [facility] electronic medical record  Vital signs were reviewed in today's medical record. Physical Exam: General: Well developed , well nourished, no acute distress Head: Normocephalic and atraumatic Eyes:  sclerae anicteric, EOMI Ears: Normal auditory acuity Mouth: No deformity or lesions Lungs: Clear throughout to auscultation Heart: Regular rate and rhythm; no murmurs, rubs or bruits Abdomen: Soft, non tender and non distended. No masses, hepatosplenomegaly or hernias noted. Normal Bowel sounds Rectal:deferred Musculoskeletal: Symmetrical with no gross deformities  Pulses:  Normal pulses noted Extremities: No clubbing, cyanosis, edema or deformities noted Neurological: Alert oriented x 4, grossly nonfocal Psychological:  Alert and cooperative. Normal mood and affect

## 2012-08-04 NOTE — Assessment & Plan Note (Signed)
Patient has intermittent upper abdominal pain probably related to  nonulcer dyspepsia.  Medications #1 trial of hyomax sublingual. Patient was instructed to contact me if symptoms worsen or hyomax is ineffective at which point I would consider upper endoscopy.

## 2012-08-05 ENCOUNTER — Telehealth: Payer: Self-pay | Admitting: *Deleted

## 2012-08-05 MED ORDER — GLYCOPYRROLATE 2 MG PO TABS: 2.0000 mg | ORAL_TABLET | Freq: Two times a day (BID) | ORAL | Status: DC

## 2012-08-05 NOTE — Telephone Encounter (Signed)
yes

## 2012-08-05 NOTE — Telephone Encounter (Signed)
Robinul sent in for pt

## 2012-08-05 NOTE — Telephone Encounter (Signed)
Dr Arlyce Dice, Patients insurance will not cover Hyosyamine can I send In Robinul ??

## 2012-08-06 ENCOUNTER — Other Ambulatory Visit: Payer: Self-pay | Admitting: Internal Medicine

## 2012-08-09 ENCOUNTER — Other Ambulatory Visit: Payer: Self-pay | Admitting: Cardiology

## 2012-08-09 MED ORDER — ATENOLOL 50 MG PO TABS: 50.0000 mg | ORAL_TABLET | Freq: Every day | ORAL | Status: DC

## 2012-09-28 ENCOUNTER — Encounter: Payer: Self-pay | Admitting: *Deleted

## 2012-10-01 ENCOUNTER — Other Ambulatory Visit: Payer: Self-pay | Admitting: Internal Medicine

## 2012-11-08 ENCOUNTER — Other Ambulatory Visit: Payer: Self-pay | Admitting: Internal Medicine

## 2012-11-18 ENCOUNTER — Telehealth: Payer: Self-pay | Admitting: Internal Medicine

## 2012-11-18 NOTE — Telephone Encounter (Signed)
11-18-12 lm with dtr who doesn't speak very much english, to set up may appt with taylor, she will have someone who speaks better english to call back /mt

## 2013-01-17 ENCOUNTER — Telehealth: Payer: Self-pay | Admitting: Internal Medicine

## 2013-01-17 ENCOUNTER — Encounter: Payer: Self-pay | Admitting: Internal Medicine

## 2013-01-17 NOTE — Telephone Encounter (Signed)
01-17-13 sent past due letter/mt °

## 2013-03-30 ENCOUNTER — Encounter: Payer: Self-pay | Admitting: Specialist

## 2013-03-30 ENCOUNTER — Encounter (INDEPENDENT_AMBULATORY_CARE_PROVIDER_SITE_OTHER): Payer: Medicare Other | Admitting: Vascular Surgery

## 2013-03-30 DIAGNOSIS — I83893 Varicose veins of bilateral lower extremities with other complications: Secondary | ICD-10-CM

## 2013-03-30 DIAGNOSIS — M79609 Pain in unspecified limb: Secondary | ICD-10-CM

## 2013-04-05 ENCOUNTER — Encounter: Payer: Self-pay | Admitting: Specialist

## 2013-04-13 ENCOUNTER — Ambulatory Visit (INDEPENDENT_AMBULATORY_CARE_PROVIDER_SITE_OTHER): Payer: Medicare Other | Admitting: Internal Medicine

## 2013-04-13 ENCOUNTER — Encounter: Payer: Self-pay | Admitting: Internal Medicine

## 2013-04-13 VITALS — BP 145/84 | HR 69 | Ht 68.0 in | Wt 197.8 lb

## 2013-04-13 DIAGNOSIS — Z950 Presence of cardiac pacemaker: Secondary | ICD-10-CM

## 2013-04-13 DIAGNOSIS — I442 Atrioventricular block, complete: Secondary | ICD-10-CM

## 2013-04-13 DIAGNOSIS — I10 Essential (primary) hypertension: Secondary | ICD-10-CM

## 2013-04-13 MED ORDER — ATENOLOL 50 MG PO TABS: 50.0000 mg | ORAL_TABLET | Freq: Every day | ORAL | Status: DC

## 2013-04-13 MED ORDER — CARVEDILOL 12.5 MG PO TABS: 12.5000 mg | ORAL_TABLET | Freq: Two times a day (BID) | ORAL | Status: DC

## 2013-04-13 MED ORDER — HYDROCHLOROTHIAZIDE 25 MG PO TABS: 12.5000 mg | ORAL_TABLET | Freq: Every day | ORAL | Status: DC

## 2013-04-13 NOTE — Patient Instructions (Addendum)
Your physician wants you to follow-up in: 6 months in the device clinic and 12 months with Dr Taylor You will receive a reminder letter in the mail two months in advance. If you don't receive a letter, please call our office to schedule the follow-up appointment.  

## 2013-04-13 NOTE — Progress Notes (Signed)
HPI Joseph Austin returns today for followup. He is a very pleasant 72 year old man with a history of complete heart block, status post permanent pacemaker insertion, and hypertension.  In the interim he has done well. He denies chest pain or shortness of breath. He has been diagnosed with gastritis. No peripheral edema. No syncope. No Known Allergies   Current Outpatient Prescriptions  Medication Sig Dispense Refill  . allopurinol (ZYLOPRIM) 300 MG tablet Take 300 mg by mouth daily.      [name] Kitchen atenolol (TENORMIN) 50 MG tablet TAKE 1 TABLET BY MOUTH DAILY  90 tablet  0  . carvedilol (COREG) 12.5 MG tablet TAKE 1 TABLET BY MOUTH TWICE DAILY  30 tablet  3  . hydrochlorothiazide (HYDRODIURIL) 25 MG tablet TAKE 1/2 TABLET BY MOUTH EVERY DAY  45 tablet  6  . pentoxifylline (TRENTAL) 400 MG CR tablet Take 400 mg by mouth 3 (three) times daily with meals.       No current facility-administered medications for this visit.     Past Medical History  Diagnosis Date  . Blockage of coronary artery of heart   . Pacemaker   . Unspecified essential hypertension   . History of heart attack 1992  . Arthritis   . Glaucoma   . Gastritis     Hx of   . HTN (hypertension)   . Syncope   . Complete heart block   . Depression     ROS:   All systems reviewed and negative except as noted in the HPI.   Past Surgical History  Procedure Laterality Date  . Pacemaker insertion       Family History  Problem Relation Age of Onset  . Colon cancer Neg Hx      History   Social History  . Marital Status: Widowed    Spouse Name: N/A    Number of Children: 1  . Years of Education: N/A   Occupational History  . Not on file.   Social History Main Topics  . Smoking status: Current Every Day Smoker  . Smokeless tobacco: Never Used  . Alcohol Use: Yes     Comment: Little usage  . Drug Use: No  . Sexual Activity: Not on file   Other Topics Concern  . Not on file   Social History Narrative   . No narrative on file     BP 145/84  Pulse 69  Ht 5\' 8"  (1.727 m)  Wt 197 lb 12.8 oz (89.721 kg)  BMI 30.08 kg/m2  Physical Exam:  Well appearing middle aged man,NAD HEENT: Unremarkable Neck:  No JVD, no thyromegally Back:  No CVA tenderness Lungs:  Clear with no wheezes HEART:  Regular rate rhythm, no murmurs, no rubs, no clicks Abd:  soft, positive bowel sounds, no organomegally, no rebound, no guarding Ext:  2 plus pulses, no edema, no cyanosis, no clubbing Skin:  No rashes no nodules Neuro:  CN II through XII intact, motor grossly intact   DEVICE  Normal device function.  See PaceArt for details.   Assess/Plan:

## 2013-04-13 NOTE — Assessment & Plan Note (Signed)
His blood pressure is a bit elevated today. He will continue his current meds.

## 2013-04-13 NOTE — Assessment & Plan Note (Signed)
His St. Jude DDD PM is working normally. Will recheck in several months. 

## 2013-04-25 LAB — PACEMAKER DEVICE OBSERVATION
AL AMPLITUDE: 4 mv
AL IMPEDENCE PM: 362.5 Ohm
AL THRESHOLD: 0.5 V
ATRIAL PACING PM: 73
BAMS-0001: 180 {beats}/min
BAMS-0003: 70 {beats}/min
BATTERY VOLTAGE: 2.9629 V
DEVICE MODEL PM: 7120024
RV LEAD IMPEDENCE PM: 412.5 Ohm
RV LEAD THRESHOLD: 0.625 V
VENTRICULAR PACING PM: 99

## 2013-04-26 ENCOUNTER — Telehealth: Payer: Self-pay | Admitting: *Deleted

## 2013-04-26 MED ORDER — HYDROCHLOROTHIAZIDE 25 MG PO TABS: 25.0000 mg | ORAL_TABLET | Freq: Every day | ORAL | Status: DC

## 2013-04-26 NOTE — Telephone Encounter (Signed)
Will forward to triage nurses

## 2013-04-26 NOTE — Telephone Encounter (Signed)
Dr. Ladona Ridgel aware that pt  has been taking HCTZ 25 mg daily. A prescription for 25 mg once a day per Dr. Ladona Ridgel,  send to pt's pharmacy; pt aware.

## 2013-04-26 NOTE — Telephone Encounter (Signed)
Patient family member called in requesting that we change his HCTZ to 25mg  daily. He says that Dr. Alwyn Ren said his bp was elevated, but is not willing to give him a prescription without him coming in for a consult. He just saw Dr. Ladona Ridgel and he said stay on current dose of 0.5 tablet daily.  Please call Belton back at [phone]. Thank you, MI

## 2013-10-10 ENCOUNTER — Ambulatory Visit (INDEPENDENT_AMBULATORY_CARE_PROVIDER_SITE_OTHER): Payer: Medicare Other | Admitting: *Deleted

## 2013-10-10 DIAGNOSIS — I442 Atrioventricular block, complete: Secondary | ICD-10-CM

## 2013-10-10 LAB — MDC_IDC_ENUM_SESS_TYPE_INCLINIC
Battery Remaining Longevity: 87.6 mo
Battery Voltage: 2.95 V
Brady Statistic RA Percent Paced: 70 %
Brady Statistic RV Percent Paced: 99.96 %
Lead Channel Impedance Value: 362.5 Ohm
Lead Channel Impedance Value: 475 Ohm
Lead Channel Pacing Threshold Pulse Width: 0.5 ms
Lead Channel Sensing Intrinsic Amplitude: 3.6 mV
Lead Channel Setting Pacing Amplitude: 1 V
Lead Channel Setting Pacing Amplitude: 1.5 V
Lead Channel Setting Pacing Pulse Width: 0.5 ms
Lead Channel Setting Sensing Sensitivity: 4 mV
MDC IDC MSMT LEADCHNL RA PACING THRESHOLD AMPLITUDE: 0.5 V
MDC IDC MSMT LEADCHNL RA PACING THRESHOLD PULSEWIDTH: 0.5 ms
MDC IDC MSMT LEADCHNL RV PACING THRESHOLD AMPLITUDE: 0.75 V
MDC IDC MSMT LEADCHNL RV SENSING INTR AMPL: 12 mV
MDC IDC PG SERIAL: 7120024
MDC IDC SESS DTM: 20150223153808

## 2013-10-10 NOTE — Progress Notes (Signed)
Pacemaker check in clinic. Normal device function. Thresholds, sensing, impedances consistent with previous measurements. Device programmed to maximize longevity. 2  mode switches <1%.  The longest episode > 3 minutes, - coumadin.  No  high ventricular rates noted. Device programmed at appropriate safety margins. Histogram distribution appropriate for patient activity level. Device programmed to optimize intrinsic conduction. Estimated longevity 7.3 years.  Patient education completed.  ROV 6 months with Dr. Ladona Ridgelaylor.

## 2013-10-26 ENCOUNTER — Encounter: Payer: Self-pay | Admitting: Internal Medicine

## 2014-03-06 ENCOUNTER — Telehealth: Payer: Self-pay | Admitting: Internal Medicine

## 2014-03-06 DIAGNOSIS — I10 Essential (primary) hypertension: Secondary | ICD-10-CM

## 2014-03-06 NOTE — Telephone Encounter (Signed)
Patients BP is running 148/89 and patient is concerned, please call and advise.

## 2014-03-06 NOTE — Telephone Encounter (Signed)
Will forward to dr taylor nurse

## 2014-03-07 NOTE — Telephone Encounter (Signed)
lmtcb Debbie Sara Keys RN  

## 2014-03-07 NOTE — Telephone Encounter (Signed)
I spoke with pt interpreter, Belton, who states pt called him yesterday about his blood pressure. Reading was 148/89. Reviewed last office note 03/2013 BP was 145/84 & no changes were made to medication.  No symptoms of dizziness, headache, or blurred vision reported to the interpreter other than blood pressure reading.  I have scheduled pt for yearly follow-up with Dr. Ladona Ridgelaylor on 04/11/14 at 4:15    ( daughter brings pt after she gets off work & needed later appt time)  Mylo Redebbie Merridy Pascoe RN

## 2014-03-07 NOTE — Telephone Encounter (Signed)
New Message  Pt called to discuss his high Blood pressures. Please call back to discuss.

## 2014-03-08 MED ORDER — CARVEDILOL 12.5 MG PO TABS: ORAL_TABLET | ORAL | Status: DC

## 2014-03-08 NOTE — Addendum Note (Signed)
Addended by: Dennis BastLANIER, Anida Deol F on: 03/08/2014 09:54 AM   Modules accepted: Orders

## 2014-03-08 NOTE — Telephone Encounter (Addendum)
Discussed with Dr Ladona Ridgelaylor, his BP's are still running high and would like for him to increase his Carvedilol to 12.5mg   1 1/2 tablets twice daily.  I have lmom for his interpreter with these recommendations and to call me back

## 2014-04-11 ENCOUNTER — Encounter: Payer: Self-pay | Admitting: Internal Medicine

## 2014-04-11 ENCOUNTER — Ambulatory Visit (INDEPENDENT_AMBULATORY_CARE_PROVIDER_SITE_OTHER): Payer: Medicare Other | Admitting: Internal Medicine

## 2014-04-11 VITALS — BP 130/72 | HR 60 | Ht 68.0 in | Wt 189.6 lb

## 2014-04-11 DIAGNOSIS — Z950 Presence of cardiac pacemaker: Secondary | ICD-10-CM

## 2014-04-11 DIAGNOSIS — I10 Essential (primary) hypertension: Secondary | ICD-10-CM

## 2014-04-11 NOTE — Progress Notes (Signed)
HPI Joseph Austin returns today for followup. He is a very pleasant 73 year old man with a history of complete heart block, status post permanent pacemaker insertion, and hypertension.  In the interim he has done well. He denies chest pain or shortness of breath. He has been diagnosed with gastritis. No peripheral edema. No syncope. He has done well during the year. His blood pressure has mostly been controlled.  No Known Allergies   Current Outpatient Prescriptions  Medication Sig Dispense Refill  . allopurinol (ZYLOPRIM) 300 MG tablet Take 300 mg by mouth daily.      [name] Kitchen atenolol (TENORMIN) 50 MG tablet Take 1 tablet (50 mg total) by mouth daily.  90 tablet  3  . carvedilol (COREG) 12.5 MG tablet Take 12.5 mg by mouth daily.      . hydrochlorothiazide (HYDRODIURIL) 25 MG tablet Take 1 tablet (25 mg total) by mouth daily.  90 tablet  3   No current facility-administered medications for this visit.     Past Medical History  Diagnosis Date  . Blockage of coronary artery of heart   . Pacemaker   . Unspecified essential hypertension   . History of heart attack 1992  . Arthritis   . Glaucoma   . Gastritis     Hx of   . HTN (hypertension)   . Syncope   . Complete heart block   . Depression     ROS:   All systems reviewed and negative except as noted in the HPI.   Past Surgical History  Procedure Laterality Date  . Pacemaker insertion       Family History  Problem Relation Age of Onset  . Colon cancer Neg Hx      History   Social History  . Marital Status: Widowed    Spouse Name: N/A    Number of Children: 1  . Years of Education: N/A   Occupational History  . Not on file.   Social History Main Topics  . Smoking status: Current Every Day Smoker  . Smokeless tobacco: Never Used  . Alcohol Use: Yes     Comment: Little usage  . Drug Use: No  . Sexual Activity: Not on file   Other Topics Concern  . Not on file   Social History Narrative  . No narrative  on file     BP 130/72  Pulse 60  Ht  (1.727 m)  Wt 189 lb 9.6 oz (86.002 kg)  BMI 28.84 kg/m2  Physical Exam:  Well appearing 73 yo man,NAD HEENT: Unremarkable Neck:  No JVD, no thyromegally Back:  No CVA tenderness Lungs:  Clear with no wheezes HEART:  Regular rate rhythm, no murmurs, no rubs, no clicks Abd:  soft, positive bowel sounds, no organomegally, no rebound, no guarding Ext:  2 plus pulses, no edema, no cyanosis, no clubbing Skin:  No rashes no nodules Neuro:  CN II through XII intact, motor grossly intact   DEVICE  Normal device function.  See PaceArt for details.   Assess/Plan:

## 2014-04-11 NOTE — Patient Instructions (Signed)
Your physician wants you to follow-up in: 6 months with device clinic and 12 months with Dr Taylor You will receive a reminder letter in the mail two months in advance. If you don't receive a letter, please call our office to schedule the follow-up appointment.  

## 2014-04-11 NOTE — Assessment & Plan Note (Signed)
His blood pressure has been fairly well controlled. He will continue his current meds.

## 2014-04-11 NOTE — Assessment & Plan Note (Signed)
His St. Jude DDD PM is working normally. Will recheck in several months. 

## 2014-04-14 ENCOUNTER — Encounter: Payer: Self-pay | Admitting: Internal Medicine

## 2014-05-03 ENCOUNTER — Other Ambulatory Visit: Payer: Self-pay | Admitting: Internal Medicine

## 2014-05-10 ENCOUNTER — Other Ambulatory Visit: Payer: Self-pay | Admitting: Internal Medicine

## 2014-11-29 ENCOUNTER — Other Ambulatory Visit: Payer: Self-pay | Admitting: Internal Medicine

## 2014-12-13 ENCOUNTER — Ambulatory Visit (INDEPENDENT_AMBULATORY_CARE_PROVIDER_SITE_OTHER): Payer: Medicare Other | Admitting: *Deleted

## 2014-12-13 DIAGNOSIS — Z950 Presence of cardiac pacemaker: Secondary | ICD-10-CM

## 2014-12-13 DIAGNOSIS — I442 Atrioventricular block, complete: Secondary | ICD-10-CM | POA: Diagnosis not present

## 2014-12-13 LAB — MDC_IDC_ENUM_SESS_TYPE_INCLINIC
Battery Remaining Longevity: 94.8 mo
Battery Voltage: 2.93 V
Brady Statistic RA Percent Paced: 68 %
Brady Statistic RV Percent Paced: 99.9 %
Lead Channel Impedance Value: 375 Ohm
Lead Channel Pacing Threshold Amplitude: 0.5 V
Lead Channel Sensing Intrinsic Amplitude: 3.4 mV
Lead Channel Setting Pacing Amplitude: 1 V
Lead Channel Setting Pacing Amplitude: 1.5 V
Lead Channel Setting Pacing Pulse Width: 0.5 ms
Lead Channel Setting Sensing Sensitivity: 4 mV
MDC IDC MSMT LEADCHNL RA PACING THRESHOLD PULSEWIDTH: 0.5 ms
MDC IDC MSMT LEADCHNL RV IMPEDANCE VALUE: 412.5 Ohm
MDC IDC MSMT LEADCHNL RV PACING THRESHOLD AMPLITUDE: 0.75 V
MDC IDC MSMT LEADCHNL RV PACING THRESHOLD PULSEWIDTH: 0.5 ms
MDC IDC MSMT LEADCHNL RV SENSING INTR AMPL: 12 mV
MDC IDC PG SERIAL: 7120024
MDC IDC SESS DTM: 20160427202045

## 2014-12-13 MED ORDER — HYDROCHLOROTHIAZIDE 25 MG PO TABS: 25.0000 mg | ORAL_TABLET | Freq: Every day | ORAL | Status: DC

## 2014-12-14 NOTE — Progress Notes (Signed)
Pacemaker check in clinic. Normal device function. Thresholds, sensing, impedances consistent with previous measurements. Device programmed to maximize longevity. 6 mode switches--- <1%, longest 3hr5920min, no anti-coag. No high ventricular rates noted. Device programmed at appropriate safety margins. Histogram distribution appropriate for patient activity level. Device programmed to optimize intrinsic conduction. Estimated longevity 8.0-8.4965yrs. ROV w/ GT in 16mo.

## 2014-12-26 DIAGNOSIS — Z0000 Encounter for general adult medical examination without abnormal findings: Secondary | ICD-10-CM | POA: Diagnosis not present

## 2014-12-26 DIAGNOSIS — E780 Pure hypercholesterolemia: Secondary | ICD-10-CM | POA: Diagnosis not present

## 2014-12-26 DIAGNOSIS — Z0001 Encounter for general adult medical examination with abnormal findings: Secondary | ICD-10-CM | POA: Diagnosis not present

## 2014-12-26 DIAGNOSIS — I10 Essential (primary) hypertension: Secondary | ICD-10-CM | POA: Diagnosis not present

## 2014-12-26 DIAGNOSIS — R5383 Other fatigue: Secondary | ICD-10-CM | POA: Diagnosis not present

## 2014-12-27 DIAGNOSIS — H402212 Chronic angle-closure glaucoma, right eye, moderate stage: Secondary | ICD-10-CM | POA: Diagnosis not present

## 2015-02-08 ENCOUNTER — Encounter: Payer: Self-pay | Admitting: Internal Medicine

## 2015-04-19 DIAGNOSIS — M25512 Pain in left shoulder: Secondary | ICD-10-CM | POA: Diagnosis not present

## 2015-04-19 DIAGNOSIS — Z79899 Other long term (current) drug therapy: Secondary | ICD-10-CM | POA: Diagnosis not present

## 2015-04-19 DIAGNOSIS — D509 Iron deficiency anemia, unspecified: Secondary | ICD-10-CM | POA: Diagnosis not present

## 2015-04-19 DIAGNOSIS — M109 Gout, unspecified: Secondary | ICD-10-CM | POA: Diagnosis not present

## 2015-05-17 ENCOUNTER — Encounter: Payer: Self-pay | Admitting: Internal Medicine

## 2015-05-17 ENCOUNTER — Ambulatory Visit (INDEPENDENT_AMBULATORY_CARE_PROVIDER_SITE_OTHER): Payer: Medicare Other | Admitting: Internal Medicine

## 2015-05-17 VITALS — BP 130/68 | HR 60 | Ht 68.0 in | Wt 188.8 lb

## 2015-05-17 DIAGNOSIS — Z950 Presence of cardiac pacemaker: Secondary | ICD-10-CM

## 2015-05-17 DIAGNOSIS — I442 Atrioventricular block, complete: Secondary | ICD-10-CM | POA: Diagnosis not present

## 2015-05-17 DIAGNOSIS — I10 Essential (primary) hypertension: Secondary | ICD-10-CM

## 2015-05-17 LAB — CUP PACEART INCLINIC DEVICE CHECK
Battery Remaining Longevity: 85.2 mo
Battery Voltage: 2.92 V
Brady Statistic RV Percent Paced: 99.89 %
Date Time Interrogation Session: 20160929172210
Lead Channel Pacing Threshold Amplitude: 0.75 V
Lead Channel Pacing Threshold Pulse Width: 0.5 ms
Lead Channel Pacing Threshold Pulse Width: 0.5 ms
Lead Channel Sensing Intrinsic Amplitude: 3.6 mV
Lead Channel Setting Pacing Amplitude: 1.5 V
Lead Channel Setting Pacing Pulse Width: 0.5 ms
Lead Channel Setting Sensing Sensitivity: 4 mV
MDC IDC MSMT LEADCHNL RA IMPEDANCE VALUE: 362.5 Ohm
MDC IDC MSMT LEADCHNL RA PACING THRESHOLD AMPLITUDE: 0.5 V
MDC IDC MSMT LEADCHNL RV IMPEDANCE VALUE: 400 Ohm
MDC IDC PG SERIAL: 7120024
MDC IDC SET LEADCHNL RV PACING AMPLITUDE: 1 V
MDC IDC STAT BRADY RA PERCENT PACED: 71 %

## 2015-05-17 MED ORDER — CARVEDILOL 25 MG PO TABS: 25.0000 mg | ORAL_TABLET | Freq: Two times a day (BID) | ORAL | Status: DC

## 2015-05-17 NOTE — Patient Instructions (Addendum)
Medication Instructions:  Your physician has recommended you make the following change in your medication:  1) Stop Atenolol 2) Increase Carvedilol to 25 mg twice daily 3) Use Tylenol pm to help with sleep    Labwork: None ordered  Testing/Procedures: None ordered   Follow-Up: .   Your physician wants you to follow-up in: 6 months in the device clinic and 12 months with Dr Court Joy will receive a reminder letter in the mail two months in advance. If you don't receive a letter, please call our office to schedule the follow-up appointment.   Any Other Special Instructions Will Be Listed Below (If Applicable).

## 2015-05-17 NOTE — Assessment & Plan Note (Signed)
His blood pressure is well controlled. He will continue his medications.

## 2015-05-17 NOTE — Assessment & Plan Note (Signed)
His St. Jude DDD PM is working normally. Will recheck in several months. 

## 2015-05-17 NOTE — Progress Notes (Signed)
HPI Mr. Eslinger returns today for followup. He is a very pleasant 74 year old man with a history of complete heart block, status post permanent pacemaker insertion, and hypertension.  In the interim he has done well. He denies chest pain or shortness of breath.  No peripheral edema. No syncope. He has done well during the year. His blood pressure has mostly been controlled.  No Known Allergies   Current Outpatient Prescriptions  Medication Sig Dispense Refill  . allopurinol (ZYLOPRIM) 300 MG tablet Take 300 mg by mouth daily.    . carvedilol (COREG) 25 MG tablet Take 1 tablet (25 mg total) by mouth 2 (two) times daily with a meal. 180 tablet 3  . hydrochlorothiazide (HYDRODIURIL) 25 MG tablet Take 1 tablet (25 mg total) by mouth daily. 90 tablet 2  . pentoxifylline (TRENTAL) 400 MG CR tablet Take 400 mg by mouth 2 (two) times daily.     No current facility-administered medications for this visit.     Past Medical History  Diagnosis Date  . Blockage of coronary artery of heart   . Pacemaker   . Unspecified essential hypertension   . History of heart attack 1992  . Arthritis   . Glaucoma   . Gastritis     Hx of   . HTN (hypertension)   . Syncope   . Complete heart block   . Depression     ROS:   All systems reviewed and negative except as noted in the HPI.   Past Surgical History  Procedure Laterality Date  . Pacemaker insertion       Family History  Problem Relation Age of Onset  . Colon cancer Neg Hx      Social History   Social History  . Marital Status: Widowed    Spouse Name: N/A  . Number of Children: 1  . Years of Education: N/A   Occupational History  . Not on file.   Social History Main Topics  . Smoking status: Current Every Day Smoker  . Smokeless tobacco: Never Used  . Alcohol Use: Yes     Comment: Little usage  . Drug Use: No  . Sexual Activity: Not on file   Other Topics Concern  . Not on file   Social History Narrative      BP 130/68 mmHg  Pulse 60  Ht  (1.727 m)  Wt 188 lb 12.8 oz (85.639 kg)  BMI 28.71 kg/m2  SpO2 96%  Physical Exam:  Well appearing 74 yo man,NAD HEENT: Unremarkable Neck:  6 cm JVD, no thyromegally Back:  No CVA tenderness Lungs:  Clear with no wheezes HEART:  Regular rate rhythm, no murmurs, no rubs, no clicks Abd:  soft, positive bowel sounds, no organomegally, no rebound, no guarding Ext:  2 plus pulses, no edema, no cyanosis, no clubbing Skin:  No rashes no nodules Neuro:  CN II through XII intact, motor grossly intact   DEVICE  Normal device function.  See PaceArt for details.   Assess/Plan:

## 2015-05-29 ENCOUNTER — Other Ambulatory Visit: Payer: Self-pay | Admitting: Internal Medicine

## 2015-05-29 ENCOUNTER — Telehealth: Payer: Self-pay | Admitting: Internal Medicine

## 2015-05-29 NOTE — Telephone Encounter (Signed)
Left message for Mr Joseph Austin to return my call

## 2015-05-29 NOTE — Telephone Encounter (Signed)
New Message  Pt c/o medication issue:  1. Name of Medication: Peneoxifylline 400 mg   2. How are you currently taking this medication (dosage and times per day)? Not taking any meds requests to have it prescribed  3. Are you having a reaction (difficulty breathing--STAT)?   4. What is your medication issue? Pt requests to have this medication prescribed for circulation. Translator cannot describe any symptoms.  Belton Mceachin called on behalf of the pt because the pt speaks spanish.   His number is [phone]

## 2015-06-06 ENCOUNTER — Other Ambulatory Visit: Payer: Self-pay | Admitting: Internal Medicine

## 2015-06-08 NOTE — Telephone Encounter (Signed)
Mr. Ronnald RampMcEachin returning call.  States Mr. Trinidad CuretMerinocabrera is requesting a Rx for Trental.  Advised for him to call his PCP. He verbalizes understanding and advise Mr. Trinidad CuretMerinocabrera.

## 2015-06-08 NOTE — Telephone Encounter (Signed)
Lm w/Mr. McEachin to return call

## 2015-06-08 NOTE — Telephone Encounter (Signed)
F/u       Pt's friend that comes to the pt's appt's and translates for pt is returning [facility] call for [facility].

## 2015-06-13 ENCOUNTER — Telehealth: Payer: Self-pay | Admitting: Internal Medicine

## 2015-06-13 NOTE — Telephone Encounter (Signed)
New Message  Pt called. Request a call back to determine if it is ok for him to take baby asprin. Please assist .

## 2015-06-13 NOTE — Telephone Encounter (Signed)
Okay to take he will relay to patrient

## 2015-06-13 NOTE — Telephone Encounter (Signed)
F/u    Pt's friend called to provide his name and phone number so that he can be called back. States the pt doesn't speak English and he translates for the pt.

## 2015-10-11 DIAGNOSIS — H402212 Chronic angle-closure glaucoma, right eye, moderate stage: Secondary | ICD-10-CM | POA: Diagnosis not present

## 2015-11-12 ENCOUNTER — Telehealth: Payer: Self-pay | Admitting: Internal Medicine

## 2015-11-12 NOTE — Telephone Encounter (Signed)
New Message  Pt dtr calling to speak w/ RN concerning pt's BP- stated it is very high, but did not have readings- will need pacific interpreters. Please call back and discuss.

## 2015-11-14 NOTE — Telephone Encounter (Signed)
Spoke with patient's daughter and he is taking all of his medications.  We will have him come in on Fri and arrange for an interpreter to come to the appointment

## 2015-11-16 ENCOUNTER — Ambulatory Visit (INDEPENDENT_AMBULATORY_CARE_PROVIDER_SITE_OTHER): Payer: Medicare Other | Admitting: Internal Medicine

## 2015-11-16 ENCOUNTER — Encounter: Payer: Self-pay | Admitting: Internal Medicine

## 2015-11-16 VITALS — BP 134/68 | HR 60 | Ht 69.0 in | Wt 186.0 lb

## 2015-11-16 DIAGNOSIS — Z950 Presence of cardiac pacemaker: Secondary | ICD-10-CM

## 2015-11-16 DIAGNOSIS — I10 Essential (primary) hypertension: Secondary | ICD-10-CM | POA: Diagnosis not present

## 2015-11-16 DIAGNOSIS — I455 Other specified heart block: Secondary | ICD-10-CM | POA: Diagnosis not present

## 2015-11-16 MED ORDER — CARVEDILOL 25 MG PO TABS: 37.5000 mg | ORAL_TABLET | Freq: Two times a day (BID) | ORAL | Status: DC

## 2015-11-16 MED ORDER — HYDROCHLOROTHIAZIDE 25 MG PO TABS: ORAL_TABLET | ORAL | Status: DC

## 2015-11-16 NOTE — Patient Instructions (Addendum)
Medication Instructions:  Your physician has recommended you make the following change in your medication:  1) Increase Carvedilol to 1 1/2 tablets twice daily   Labwork: None ordered   Testing/Procedures: None ordered   Follow-Up: Your physician wants you to follow-up in: 12 months with Dr Taylor You will receive a reminder letter in the mail two months in advance. If you don't receive a letter, please call our office to schedule the follow-up appointment.   Any Other Special Instructions Will Be Listed Below (If Applicable).     If you need a refill on your cardiac medications before your next appointment, please call your pharmacy.   

## 2015-11-16 NOTE — Progress Notes (Signed)
HPI [name] returns today for followup. He is a very pleasant 75 year old man with a history of complete heart block, status post permanent pacemaker insertion, and hypertension.  In the interim he has done well. He denies chest pain or shortness of breath.  No peripheral edema. No syncope. He has done well during the year. His blood pressure has been a little high and he denies medical non-compliance.   No Known Allergies   Current Outpatient Prescriptions  Medication Sig Dispense Refill  . allopurinol (ZYLOPRIM) 300 MG tablet Take 300 mg by mouth daily.    . carvedilol (COREG) 25 MG tablet Take 1.5 tablets (37.5 mg total) by mouth 2 (two) times daily with a meal. 270 tablet 3  . hydrochlorothiazide (HYDRODIURIL) 25 MG tablet TAKE 1 TABLET(25 MG) BY MOUTH DAILY 90 tablet 3   No current facility-administered medications for this visit.     Past Medical History  Diagnosis Date  . Blockage of coronary artery of heart (HCC)   . Pacemaker   . Unspecified essential hypertension   . History of heart attack 1992  . Arthritis   . Glaucoma   . Gastritis     Hx of   . HTN (hypertension)   . Syncope   . Complete heart block (HCC)   . Depression     ROS:   All systems reviewed and negative except as noted in the HPI.   Past Surgical History  Procedure Laterality Date  . Pacemaker insertion       Family History  Problem Relation Age of Onset  . Colon cancer Neg Hx      Social History   Social History  . Marital Status: Widowed    Spouse Name: N/A  . Number of Children: 1  . Years of Education: N/A   Occupational History  . Not on file.   Social History Main Topics  . Smoking status: Current Every Day Smoker  . Smokeless tobacco: Never Used  . Alcohol Use: Yes     Comment: Little usage  . Drug Use: No  . Sexual Activity: Not on file   Other Topics Concern  . Not on file   Social History Narrative     BP 134/68 mmHg  Pulse 60  Ht 5\' 9"  (1.753 m)   Wt 186 lb (84.369 kg)  BMI 27.45 kg/m2  Physical Exam:  Well appearing 75 yo man,NAD HEENT: Unremarkable Neck:  6 cm JVD, no thyromegally Back:  No CVA tenderness Lungs:  Clear with no wheezes HEART:  Regular rate rhythm, no murmurs, no rubs, no clicks Abd:  soft, positive bowel sounds, no organomegally, no rebound, no guarding Ext:  2 plus pulses, no edema, no cyanosis, no clubbing Skin:  No rashes no nodules Neuro:  CN II through XII intact, motor grossly intact   DEVICE  Normal device function.  See PaceArt for details.   Assess/Plan: 1. Uncontrolled HTN - his blood pressure was reasonably well controlled today but has been in the 180 range. I have asked him to increase coreg to 37.5 mg twice daily. He is encouraged to maintain a low sodium diet. 2. Complete heart block - he is asymptomatic, s/p PPM 3. PPM - his St. Jude device is working normally. Will recheck in several months.  Joseph ReevesGregg Duquan Austin,M.D.

## 2016-02-18 ENCOUNTER — Telehealth: Payer: Self-pay | Admitting: Cardiology

## 2016-02-18 ENCOUNTER — Encounter: Payer: Medicare Other | Admitting: *Deleted

## 2016-02-18 NOTE — Telephone Encounter (Signed)
LMOVM reminding pt to send remote transmission.   

## 2016-02-22 ENCOUNTER — Encounter: Payer: Self-pay | Admitting: Cardiology

## 2016-03-22 ENCOUNTER — Ambulatory Visit (INDEPENDENT_AMBULATORY_CARE_PROVIDER_SITE_OTHER): Payer: Medicare Other | Admitting: Urgent Care

## 2016-03-22 ENCOUNTER — Ambulatory Visit (INDEPENDENT_AMBULATORY_CARE_PROVIDER_SITE_OTHER): Payer: Medicare Other

## 2016-03-22 VITALS — BP 126/82 | HR 83 | Temp 98.2°F | Resp 14 | Ht 68.0 in | Wt 192.0 lb

## 2016-03-22 DIAGNOSIS — M25552 Pain in left hip: Secondary | ICD-10-CM

## 2016-03-22 DIAGNOSIS — M7918 Myalgia, other site: Secondary | ICD-10-CM

## 2016-03-22 DIAGNOSIS — M1612 Unilateral primary osteoarthritis, left hip: Secondary | ICD-10-CM | POA: Diagnosis not present

## 2016-03-22 DIAGNOSIS — M791 Myalgia: Secondary | ICD-10-CM

## 2016-03-22 MED ORDER — PREDNISONE 20 MG PO TABS: 40.0000 mg | ORAL_TABLET | Freq: Every day | ORAL | 0 refills | Status: DC

## 2016-03-22 NOTE — Progress Notes (Signed)
    MRN: [id number] DOB: 15-Dec-1940  Subjective:   Joseph Austin is a 75 y.o. male presenting for chief complaint of Hip Pain (right hip pain three day)  Reports 3 day history of left hip pain, left buttock pain. Pain is intermittent but has been there every day and can be severe. It radiates anteriorly across to his groin. Has occasional and transient tingling of the left posterior hip. Of note, patient admits history of arthritis, has received multiple steroid injections in the past with good results. He also recently traveled to and from [place] and has had hip pain since then. Denies falls, trauma, dysuria, hematuria, fever, constipation, bloody stools.  [place] has a current medication list which includes the following prescription(s): allopurinol, carvedilol, and hydrochlorothiazide. Also has No Known Allergies.  Jotham  has a past medical history of Arthritis; Blockage of coronary artery of heart (HCC); Complete heart block (HCC); Depression; Gastritis; Glaucoma; History of heart attack (1992); HTN (hypertension); Pacemaker; Syncope; and Unspecified essential hypertension. Also  has a past surgical history that includes Pacemaker insertion.  Objective:   Vitals: BP 126/82   Pulse 83   Temp 98.2 F (36.8 C) (Oral)   Resp 14   Ht 5\' 8"  (1.727 m)   Wt 192 lb (87.1 kg)   SpO2 97%   BMI 29.19 kg/m   Physical Exam  Constitutional: He is oriented to person, place, and time. He appears well-developed and well-nourished.  Cardiovascular: Normal rate.   Pulmonary/Chest: Effort normal.  Musculoskeletal:       Left hip: He exhibits decreased range of motion (secondary to pain) and tenderness. He exhibits normal strength, no bony tenderness, no swelling, no crepitus and no deformity.       Legs: Neurological: He is alert and oriented to person, place, and time.   Dg Hip Unilat W Or W/o Pelvis 2-3 Views Left  Result Date: 03/22/2016 CLINICAL DATA:  Left hip and buttock pain. EXAM: DG HIP  (WITH OR WITHOUT PELVIS) 2-3V LEFT COMPARISON:  None. FINDINGS: Mild degenerative spurring at each hip joint. Additional mild degenerative change in the lower lumbar spine. 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 Osseous alignment is normal. Bone mineralization is normal. No fracture line or displaced fracture fragment seen. No acute or suspicious osseous finding. Adjacent soft tissues are unremarkable. IMPRESSION: 1. No acute findings. 2. 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 Electronically Signed   By: Bary Richard M.D.   On: 03/22/2016 12:51   Assessment and Plan :   1. Left hip pain 2. Left buttock pain - I believe the patient's pain is related to his arthritis given the classic presentation and x-ray findings, recent travel to [place]. I offered patient short steroid course. Referral to ortho pending.  Wallis Bamberg, PA-C [facility] [phone] 03/22/2016 11:34 AM

## 2016-03-22 NOTE — Patient Instructions (Addendum)
Tylenol Tome  cada 6 horas o tome  cada 8 horas para dolor y inflammacion.   Osteoartritis (Osteoarthritis) La osteoartritis es una enfermedad que provoca dolor e inflamacin en las articulaciones. Ocurre cuando el cartlago de la articulacin afectada se desgasta. El cartlago acta como una almohadilla que cubre los extremos de los huesos que forman una articulacin. La osteoartritis es la ms frecuente de reumatismo articular. Afecta a menudo a los ancianos. Las articulaciones que se ven ms afectadas por esta afeccin son las que se encuentran en las siguientes zonas:  Los extremos de los dedos.  Los pulgares.  El cuello.  La parte inferior de la espalda.  Las rodillas.  Las caderas CAUSAS  Con el paso del [place], el cartlago que recubre los extremos de los huesos comienza a Acupuncturist. Esto provoca friccin AG[employer] Corporation, lo que causa dolor y entumecimiento en las articulaciones afectadas.  FACTORES DE RIESGO Ciertos factores pueden aumentar las probabilidades de padecer osteoartritis, incluidos los siguientes:  Edad avanzada.  Exceso de Runner, broadcasting/film/video.  Uso excesivo de la articulacin.  Lesin previa en la articulacin. SIGNOS Y SNTOMAS   Dolor, hinchazn y entumecimiento en la articulacin.  Con el tiempo, la articulacin pierde su forma normal.  Pueden formarse pequeos depsitos de hueso (ostefitos) en los extremos de Nurse, learning disability.  Algunos trozos de Dow Chemical o cartlago pueden separarse y flotar dentro del espacio de la articulacin. Esto puede causar ms dolor y lesiones. DIAGNSTICO  El mdico le preguntar acerca de sus sntomas y le har un examen fsico. Le indicarn varios estudios, como:  Radiografas de Statistician.  Anlisis de sangre para descartar otros tipos de artritis. Pueden usarse pruebas adicionales para diagnosticar la enfermedad. TRATAMIENTO  Los PepsiCo del tratamiento son Human resources officer y mejorar el  funcionamiento de Nurse, learning disability. Los planes de tratamiento pueden incluir lo siguiente:  Un programa de ejercicios recomendado que permita el descanso y el alivio de la articulacin.  Un plan de control del peso.  Tcnicas de Occidental Petroleum, como las siguientes:  Aplicacin correcta de fro y Airline pilot.  Impulsos elctricos enviados a las terminaciones nerviosas que se encuentran debajo de la piel (neuroestimulacin elctrica transcutnea [TENS]).  Masajes.  Ciertos suplementos nutricionales.  Medicamentos para Human resources officer como:  Paracetamol.  Antiinflamatorios no esteroides (AINE), como el naproxeno.  Narcticos o agentes de accin central, como el tramadol.  Corticoides. Estos se pueden administrar por va oral o mediante una inyeccin.  Ciruga para reposicionar los Trans[place]e y Engineer, materials (osteotoma) o para retirar las piezas sueltas de hueso y TEFL teacher. Puede ser necesario el reemplazo de las articulaciones en estadios avanzados de la enfermedad. INSTRUCCIONES PARA EL CUIDADO EN EL HOGAR   Tome los medicamentos solamente como se lo haya indicado el mdico.  Mantenga un peso saludable. Siga las instrucciones del mdico con respecto al control del [place]. Esto puede incluir instrucciones Software engineer.  Practique los ejercicios que le indiquen. Es posible que el mdico le recomiende tipos especficos de ejercicios. Estos pueden incluir los siguientes:  Ejercicios de fortalecimiento Se realizan para fortalecer los msculos que sostienen las articulaciones afectadas por la artritis. Pueden realizarse con peso o con bandas para agregar resistencia.  Actividades Rhona Raider. Son Programmer, applications a paso ligero, gimnasia [place] de bajo impacto, que acelere el corazn.  Actividades de amplitud de movimientos. Dan agilidad a las articulaciones.  Ejercicios de equilibrio y [place]. Ayudan a McKesson se necesitan para la  vida diaria.  Haga  descansar a las articulaciones segn las indicaciones del mdico.  Concurra a todas las visitas de control como se lo haya indicado el mdico. SOLICITE ATENCIN MDICA SI:   La piel se pone roja.  Aparece una erupcin adems del dolor en la articulacin.  El dolor en la articulacin empeora.  Tiene fiebre y siente dolor en la articulacin o el msculo. SOLICITE ATENCIN MDICA DE INMEDIATO SI:  Nota una prdida importante de peso o del apetito.  Tiene transpiracin nocturna. PARA Parthenia Ames MS INFORMACIN   The [employer] de Artritis y Event organiser Musculoesquelticas y Dermatolgicas [facility] of Arthritis and Musculoskeletal and Skin Diseases): www.niams.http://www.myers.net/.  Instituto [employer] el Envejecimiento ([employer] on Aging): https://walker.com/.  Instituto Norteamericano de Advice worker of Rheumatology): www.rheumatology.org.   Esta informacin no tiene como fin reemplazar el consejo del mdico. Asegrese de hacerle al mdico cualquier pregunta que tenga.   Document Released: 05/14/2005 Document Revised: 08/25/2014 Elsevier Interactive Patient Education [employer].    IF you received an x-ray today, you will receive an invoice from [facility]. Please contact [facility] at [phone] with questions or concerns regarding your invoice.   IF you received labwork today, you will receive an invoice from [employer]. Please contact [facility] at [phone] with questions or concerns regarding your invoice.   Our billing staff will not be able to assist you with questions regarding bills from these companies.  You will be contacted with the lab results as soon as they are available. The fastest way to get your results is to activate your My Chart account. Instructions are located on the last page of this paperwork. If you have not heard from [place] regarding the results in 2 weeks, please contact  this office.

## 2016-04-09 ENCOUNTER — Telehealth: Payer: Self-pay | Admitting: Family Medicine

## 2016-04-09 DIAGNOSIS — M5136 Other intervertebral disc degeneration, lumbar region: Secondary | ICD-10-CM

## 2016-04-09 DIAGNOSIS — M160 Bilateral primary osteoarthritis of hip: Secondary | ICD-10-CM

## 2016-04-09 NOTE — Telephone Encounter (Signed)
Placed an urgent referral to ortho and let patient know that they would be called soon for their appointment.

## 2016-04-09 NOTE — Telephone Encounter (Signed)
I apologize - I meant to place the referral at his visit but did not. I will place it as an urgent referral since it's been nearly 3 weeks.

## 2016-04-09 NOTE — Telephone Encounter (Signed)
[facility] he will let pt know whats going on and what practices to go to for the ortho referral please call at [phone]

## 2016-04-09 NOTE — Telephone Encounter (Signed)
Pt called to see if Marquita PalmsMario can put in a referral for pt to go see a Ortho Doctor for his Rt Hip pt was here on 03-22-2016 for this

## 2016-04-10 DIAGNOSIS — H402212 Chronic angle-closure glaucoma, right eye, moderate stage: Secondary | ICD-10-CM | POA: Diagnosis not present

## 2016-04-15 DIAGNOSIS — M109 Gout, unspecified: Secondary | ICD-10-CM | POA: Diagnosis not present

## 2016-04-15 DIAGNOSIS — M545 Low back pain: Secondary | ICD-10-CM | POA: Diagnosis not present

## 2016-04-30 DIAGNOSIS — M545 Low back pain: Secondary | ICD-10-CM | POA: Diagnosis not present

## 2016-05-08 ENCOUNTER — Telehealth: Payer: Self-pay | Admitting: Internal Medicine

## 2016-05-08 NOTE — Telephone Encounter (Signed)
New message    Pts is calling b/c he is confused about the dosage of his med per the pts english speaking friend. Please call.

## 2016-05-09 NOTE — Telephone Encounter (Signed)
I spoke with Joseph Austin and made him aware of the pt's dosage of Carvedilol 25mg  take one and one-half tablet by mouth twice a day. Joseph Austin confirmed that the pt has been taking this medication correctly.

## 2016-05-09 NOTE — Telephone Encounter (Signed)
Today pt's interpreter called  and needs a call back about CARVEDILOL 25 mg.  How should he take this.

## 2016-05-20 ENCOUNTER — Other Ambulatory Visit: Payer: Self-pay | Admitting: Internal Medicine

## 2016-08-22 ENCOUNTER — Other Ambulatory Visit: Payer: Self-pay | Admitting: Internal Medicine

## 2016-08-22 DIAGNOSIS — Z950 Presence of cardiac pacemaker: Secondary | ICD-10-CM

## 2016-08-22 NOTE — Telephone Encounter (Signed)
Rx refill sent to pharmacy. 

## 2016-09-25 DIAGNOSIS — G8929 Other chronic pain: Secondary | ICD-10-CM | POA: Insufficient documentation

## 2016-09-25 DIAGNOSIS — M545 Low back pain, unspecified: Secondary | ICD-10-CM | POA: Insufficient documentation

## 2016-10-16 DIAGNOSIS — H402212 Chronic angle-closure glaucoma, right eye, moderate stage: Secondary | ICD-10-CM | POA: Diagnosis not present

## 2016-12-20 ENCOUNTER — Other Ambulatory Visit: Payer: Self-pay | Admitting: Internal Medicine

## 2017-01-26 ENCOUNTER — Encounter (INDEPENDENT_AMBULATORY_CARE_PROVIDER_SITE_OTHER): Payer: Self-pay

## 2017-01-26 ENCOUNTER — Encounter: Payer: Self-pay | Admitting: Internal Medicine

## 2017-01-26 ENCOUNTER — Ambulatory Visit (INDEPENDENT_AMBULATORY_CARE_PROVIDER_SITE_OTHER): Payer: Medicare Other | Admitting: Internal Medicine

## 2017-01-26 VITALS — BP 110/60 | HR 60 | Ht 68.0 in | Wt 189.0 lb

## 2017-01-26 DIAGNOSIS — I442 Atrioventricular block, complete: Secondary | ICD-10-CM

## 2017-01-26 DIAGNOSIS — Z950 Presence of cardiac pacemaker: Secondary | ICD-10-CM | POA: Diagnosis not present

## 2017-01-26 NOTE — Progress Notes (Signed)
HPI  [name] returns today for followup. He is a very pleasant 76 year old man with a history of complete heart block, status post permanent pacemaker insertion, and hypertension.  In the interim he has done well. He denies chest pain or shortness of breath.  No peripheral edema. No syncope. He has done well during the year. His blood pressure has been a little high and he denies medical non-compliance.  He has recently traveled to [place] to see his family. No syncope.  No Known Allergies   Current Outpatient Prescriptions  Medication Sig Dispense Refill  . allopurinol (ZYLOPRIM) 300 MG tablet Take 300 mg by mouth daily.    . carvedilol (COREG) 25 MG tablet Take 1.5 tablets (37.5 mg total) by mouth 2 (two) times daily with a meal. 270 tablet 0  . hydrochlorothiazide (HYDRODIURIL) 25 MG tablet Take 1 tablet (25 mg total) by mouth daily. *Patient is overdue for a one year follow up appt. Please call and schedule for further refills* 30 tablet 0  . predniSONE (DELTASONE) 20 MG tablet Take 2 tablets (40 mg total) by mouth daily with breakfast. 10 tablet 0   No current facility-administered medications for this visit.      Past Medical History:  Diagnosis Date  . Arthritis   . Blockage of coronary artery of heart (HCC)   . Complete heart block (HCC)   . Depression   . Gastritis    Hx of   . Glaucoma   . History of heart attack 1992  . HTN (hypertension)   . Pacemaker   . Syncope   . Unspecified essential hypertension     ROS:   All systems reviewed and negative except as noted in the HPI.   Past Surgical History:  Procedure Laterality Date  . PACEMAKER INSERTION       Family History  Problem Relation Age of Onset  . Colon cancer Neg Hx      Social History   Social History  . Marital status: Widowed    Spouse name: N/A  . Number of children: 1  . Years of education: N/A   Occupational History  . Not on file.   Social History Main Topics  . Smoking  status: Current Every Day Smoker  . Smokeless tobacco: Never Used  . Alcohol use Yes     Comment: Little usage  . Drug use: No  . Sexual activity: Not on file   Other Topics Concern  . Not on file   Social History Narrative  . No narrative on file     BP 110/60   Pulse 60   Ht 5\' 8"  (1.727 m)   Wt 189 lb (85.7 kg)   SpO2 95%   BMI 28.74 kg/m   Physical Exam:  Well appearing 76 yo man,NAD HEENT: Unremarkable Neck:  6 cm JVD, no thyromegally Back:  No CVA tenderness Lungs:  Clear with no wheezes HEART:  Regular rate rhythm, no murmurs, no rubs, no clicks Abd:  soft, positive bowel sounds, no organomegally, no rebound, no guarding Ext:  2 plus pulses, no edema, no cyanosis, no clubbing Skin:  No rashes no nodules Neuro:  CN II through XII intact, motor grossly intact   DEVICE  Normal device function.  See PaceArt for details.   Assess/Plan: 1.  HTN heart disease - his blood pressure was reasonably well controlled today. He will continue coreg 37.5 bid and HCTZ.  2. Complete heart block - he is asymptomatic, s/p PPM 3.  PPM - his St. Jude device is working normally. Will recheck in several months. He has about 4 years on the battery.  Leonia Reeves.D.

## 2017-01-26 NOTE — Patient Instructions (Signed)
Medication Instructions:  Your physician recommends that you continue on your current medications as directed. Please refer to the Current Medication list given to you today.   Labwork: None Ordered   Testing/Procedures: None Ordered   Follow-Up: Your physician wants you to follow-up in: 6 months with Device Clinic and 1 year with Dr. Taylor.  You will receive a reminder letter in the mail two months in advance. If you don't receive a letter, please call our office to schedule the follow-up appointment.    Any Other Special Instructions Will Be Listed Below (If Applicable).     If you need a refill on your cardiac medications before your next appointment, please call your pharmacy.   

## 2017-01-27 LAB — CUP PACEART INCLINIC DEVICE CHECK
Battery Remaining Longevity: 45 mo
Battery Voltage: 2.84 V
Brady Statistic RV Percent Paced: 99.17 %
Date Time Interrogation Session: 20180611200245
Implantable Lead Implant Date: 20110310
Implantable Pulse Generator Implant Date: 20110310
Lead Channel Impedance Value: 362.5 Ohm
Lead Channel Impedance Value: 362.5 Ohm
Lead Channel Pacing Threshold Amplitude: 0.5 V
Lead Channel Pacing Threshold Amplitude: 0.5 V
Lead Channel Pacing Threshold Amplitude: 0.75 V
Lead Channel Pacing Threshold Pulse Width: 0.5 ms
Lead Channel Pacing Threshold Pulse Width: 0.5 ms
Lead Channel Setting Pacing Amplitude: 1 V
Lead Channel Setting Sensing Sensitivity: 4 mV
MDC IDC LEAD IMPLANT DT: 20110310
MDC IDC LEAD LOCATION: 753859
MDC IDC LEAD LOCATION: 753860
MDC IDC MSMT LEADCHNL RA PACING THRESHOLD PULSEWIDTH: 0.5 ms
MDC IDC MSMT LEADCHNL RA SENSING INTR AMPL: 2.9 mV
MDC IDC MSMT LEADCHNL RV PACING THRESHOLD AMPLITUDE: 0.75 V
MDC IDC MSMT LEADCHNL RV PACING THRESHOLD PULSEWIDTH: 0.5 ms
MDC IDC MSMT LEADCHNL RV SENSING INTR AMPL: 7.7 mV
MDC IDC PG SERIAL: 7120024
MDC IDC SET LEADCHNL RA PACING AMPLITUDE: 1.5 V
MDC IDC SET LEADCHNL RV PACING PULSEWIDTH: 0.5 ms
MDC IDC STAT BRADY RA PERCENT PACED: 61 %

## 2017-03-16 DIAGNOSIS — Z23 Encounter for immunization: Secondary | ICD-10-CM | POA: Diagnosis not present

## 2017-03-16 DIAGNOSIS — F17200 Nicotine dependence, unspecified, uncomplicated: Secondary | ICD-10-CM | POA: Diagnosis not present

## 2017-03-16 DIAGNOSIS — M1A00X0 Idiopathic chronic gout, unspecified site, without tophus (tophi): Secondary | ICD-10-CM | POA: Insufficient documentation

## 2017-03-16 DIAGNOSIS — Z1211 Encounter for screening for malignant neoplasm of colon: Secondary | ICD-10-CM | POA: Diagnosis not present

## 2017-03-16 DIAGNOSIS — I10 Essential (primary) hypertension: Secondary | ICD-10-CM | POA: Diagnosis not present

## 2017-03-16 DIAGNOSIS — R05 Cough: Secondary | ICD-10-CM | POA: Diagnosis not present

## 2017-03-16 DIAGNOSIS — Z950 Presence of cardiac pacemaker: Secondary | ICD-10-CM | POA: Diagnosis not present

## 2017-03-16 DIAGNOSIS — H4089 Other specified glaucoma: Secondary | ICD-10-CM | POA: Insufficient documentation

## 2017-03-21 DIAGNOSIS — R7309 Other abnormal glucose: Secondary | ICD-10-CM | POA: Diagnosis not present

## 2017-03-21 DIAGNOSIS — I10 Essential (primary) hypertension: Secondary | ICD-10-CM | POA: Diagnosis not present

## 2017-03-30 ENCOUNTER — Other Ambulatory Visit: Payer: Self-pay | Admitting: Internal Medicine

## 2017-04-16 DIAGNOSIS — H402212 Chronic angle-closure glaucoma, right eye, moderate stage: Secondary | ICD-10-CM | POA: Diagnosis not present

## 2017-04-22 DIAGNOSIS — R0602 Shortness of breath: Secondary | ICD-10-CM | POA: Diagnosis not present

## 2017-05-03 ENCOUNTER — Other Ambulatory Visit: Payer: Self-pay | Admitting: Internal Medicine

## 2017-05-03 DIAGNOSIS — Z950 Presence of cardiac pacemaker: Secondary | ICD-10-CM

## 2017-05-18 DIAGNOSIS — Z1211 Encounter for screening for malignant neoplasm of colon: Secondary | ICD-10-CM | POA: Diagnosis not present

## 2017-05-18 DIAGNOSIS — I10 Essential (primary) hypertension: Secondary | ICD-10-CM | POA: Diagnosis not present

## 2017-05-18 DIAGNOSIS — Z23 Encounter for immunization: Secondary | ICD-10-CM | POA: Diagnosis not present

## 2017-05-18 DIAGNOSIS — R7303 Prediabetes: Secondary | ICD-10-CM | POA: Insufficient documentation

## 2017-05-18 DIAGNOSIS — M1A00X0 Idiopathic chronic gout, unspecified site, without tophus (tophi): Secondary | ICD-10-CM | POA: Diagnosis not present

## 2017-05-18 DIAGNOSIS — F17200 Nicotine dependence, unspecified, uncomplicated: Secondary | ICD-10-CM | POA: Insufficient documentation

## 2017-09-02 ENCOUNTER — Ambulatory Visit (INDEPENDENT_AMBULATORY_CARE_PROVIDER_SITE_OTHER): Payer: Medicare Other | Admitting: *Deleted

## 2017-09-02 DIAGNOSIS — I442 Atrioventricular block, complete: Secondary | ICD-10-CM

## 2017-09-03 LAB — CUP PACEART INCLINIC DEVICE CHECK
Battery Remaining Longevity: 27 mo
Battery Voltage: 2.8 V
Implantable Lead Implant Date: 20110310
Implantable Lead Location: 753859
Implantable Lead Location: 753860
Implantable Pulse Generator Implant Date: 20110310
Lead Channel Pacing Threshold Amplitude: 0.625 V
Lead Channel Pacing Threshold Amplitude: 0.75 V
Lead Channel Pacing Threshold Pulse Width: 0.5 ms
Lead Channel Pacing Threshold Pulse Width: 0.5 ms
Lead Channel Sensing Intrinsic Amplitude: 1.3 mV
Lead Channel Sensing Intrinsic Amplitude: 8.3 mV
Lead Channel Setting Pacing Amplitude: 1 V
Lead Channel Setting Pacing Amplitude: 1.625
Lead Channel Setting Pacing Pulse Width: 0.5 ms
MDC IDC LEAD IMPLANT DT: 20110310
MDC IDC MSMT LEADCHNL RA IMPEDANCE VALUE: 337.5 Ohm
MDC IDC MSMT LEADCHNL RV IMPEDANCE VALUE: 412.5 Ohm
MDC IDC MSMT LEADCHNL RV PACING THRESHOLD AMPLITUDE: 0.75 V
MDC IDC MSMT LEADCHNL RV PACING THRESHOLD PULSEWIDTH: 0.5 ms
MDC IDC PG SERIAL: 7120024
MDC IDC SESS DTM: 20190116204743
MDC IDC SET LEADCHNL RV SENSING SENSITIVITY: 4 mV
MDC IDC STAT BRADY RA PERCENT PACED: 64 %
MDC IDC STAT BRADY RV PERCENT PACED: 99 %

## 2017-09-03 NOTE — Progress Notes (Signed)
Pacemaker check in clinic. Normal device function. Threshold, sensing, impedances consistent with previous measurements. Device programmed to maximize longevity. 8.5% AT/AF burden, persistent since 08/20/17, no sx's per patient (through interpreter line)-no OAC, 76yo M, h/o HTN. No high ventricular rates noted. Device programmed at appropriate safety margins. Histogram distribution appropriate for patient activity level. Device programmed to optimize intrinsic conduction. Estimated longevity 2.3 years. Patient will follow up with [facility] in 01-2018.

## 2017-09-09 DIAGNOSIS — M1A00X0 Idiopathic chronic gout, unspecified site, without tophus (tophi): Secondary | ICD-10-CM | POA: Diagnosis not present

## 2017-09-09 DIAGNOSIS — R7303 Prediabetes: Secondary | ICD-10-CM | POA: Diagnosis not present

## 2017-09-09 DIAGNOSIS — Z23 Encounter for immunization: Secondary | ICD-10-CM | POA: Diagnosis not present

## 2017-09-09 DIAGNOSIS — F17200 Nicotine dependence, unspecified, uncomplicated: Secondary | ICD-10-CM | POA: Diagnosis not present

## 2017-09-09 DIAGNOSIS — I10 Essential (primary) hypertension: Secondary | ICD-10-CM | POA: Diagnosis not present

## 2017-09-12 DIAGNOSIS — R7303 Prediabetes: Secondary | ICD-10-CM | POA: Diagnosis not present

## 2018-01-06 DIAGNOSIS — H402212 Chronic angle-closure glaucoma, right eye, moderate stage: Secondary | ICD-10-CM | POA: Diagnosis not present

## 2018-01-21 ENCOUNTER — Encounter: Payer: Self-pay | Admitting: Internal Medicine

## 2018-01-21 ENCOUNTER — Ambulatory Visit (INDEPENDENT_AMBULATORY_CARE_PROVIDER_SITE_OTHER): Payer: Medicare Other | Admitting: Internal Medicine

## 2018-01-21 VITALS — BP 124/72 | HR 92 | Ht 68.0 in | Wt 185.0 lb

## 2018-01-21 DIAGNOSIS — I442 Atrioventricular block, complete: Secondary | ICD-10-CM | POA: Diagnosis not present

## 2018-01-21 DIAGNOSIS — Z950 Presence of cardiac pacemaker: Secondary | ICD-10-CM

## 2018-01-21 LAB — CUP PACEART INCLINIC DEVICE CHECK
Date Time Interrogation Session: 20190606150710
Implantable Lead Location: 753859
Implantable Lead Location: 753860
Implantable Pulse Generator Implant Date: 20110310
Lead Channel Impedance Value: 460 Ohm
Lead Channel Pacing Threshold Amplitude: 0.5 V
Lead Channel Pacing Threshold Amplitude: 0.75 V
Lead Channel Pacing Threshold Pulse Width: 0.5 ms
Lead Channel Sensing Intrinsic Amplitude: 2.8 mV
MDC IDC LEAD IMPLANT DT: 20110310
MDC IDC LEAD IMPLANT DT: 20110310
MDC IDC MSMT LEADCHNL RA IMPEDANCE VALUE: 360 Ohm
MDC IDC MSMT LEADCHNL RV PACING THRESHOLD PULSEWIDTH: 0.5 ms
MDC IDC PG SERIAL: 7120024
MDC IDC SET LEADCHNL RA PACING AMPLITUDE: 1.625
MDC IDC SET LEADCHNL RV PACING AMPLITUDE: 1 V
MDC IDC SET LEADCHNL RV PACING PULSEWIDTH: 0.5 ms
MDC IDC SET LEADCHNL RV SENSING SENSITIVITY: 4 mV
MDC IDC STAT BRADY RA PERCENT PACED: 56 %
MDC IDC STAT BRADY RV PERCENT PACED: 98 %

## 2018-01-21 MED ORDER — APIXABAN 5 MG PO TABS: 5.0000 mg | ORAL_TABLET | Freq: Two times a day (BID) | ORAL | 11 refills | Status: DC

## 2018-01-21 NOTE — Patient Instructions (Addendum)
Medication Instructions:  Your physician has recommended you make the following change in your medication:  1.  Start taking Eliquis 5 mg one tablet by mouth twice a day.  Labwork: None ordered.  Testing/Procedures: None ordered.  Follow-Up:  Your next appointment is in 6 months with the device clinic for a device check. Your appointment is on July 22, 2018 at 9:00 am.   Your physician wants you to follow-up in: one year with Dr. Ladona Ridgelaylor.   You will receive a reminder letter in the mail two months in advance. If you don't receive a letter, please call our office to schedule the follow-up appointment.   Any Other Special Instructions Will Be Listed Below (If Applicable).  If you need a refill on your cardiac medications before your next appointment, please call your pharmacy.   Apixaban oral tablets Qu es este medicamento? El APIXABAN es un anticoagulante (diluyente de la [place]). Se utiliza para reducir la posibilidad de derrame cerebral en los pacientes con una afeccin mdica llamada fibrilacin auricular. Se utiliza tambin para tratar o prevenir cogulos sanguneos en los pulmones o las venas. Este medicamento puede ser utilizado para otros usos; si tiene alguna pregunta consulte con su proveedor de atencin mdica o con su farmacutico. MARCAS COMUNES: Eliquis Qu le debo informar a mi profesional de la salud antes de tomar este medicamento? Necesita saber si usted presenta alguno de los siguientes problemas o situaciones: trastornos de sangrado sangrado en el cerebro sangre en las heces (heces de color oscuro o de aspecto alquitranado) o si vomita sangre antecedentes de sangrado estomacal enfermedad renal enfermedad heptica vlvula cardaca mecnica una reaccin alrgica o inusual al apixaban, otros medicamentos, alimentos, colorantes o conservantes si est embarazada o buscando quedar embarazada si est amamantando a un beb Cmo debo utilizar este medicamento? Tome este  medicamento por va oral con un vaso de agua. Siga las instrucciones de la etiqueta del [place] Secamedicamento. Puede tomarlo con o sin alimentos. Si el Social workermedicamento le produce malestar estomacal, tmelo con alimentos. Tome su medicamento a intervalos regulares. No lo tome con una frecuencia mayor a la indicada. No deje de tomarlo, excepto si as lo indica su mdico. Dejar de tomar este medicamento podra aumentar su riesgo de tener un cogulo sanguneo. Asegrese de volver a surtir su receta antes de quedarse sin medicamento. Hable con su pediatra para informarse acerca del uso de este medicamento en nios. Puede requerir atencin especial. Sobredosis: Pngase en contacto inmediatamente con un centro toxicolgico o una sala de urgencia si usted cree que haya tomado demasiado medicamento. ATENCIN: Reynolds AmericanEste medicamento es solo para usted. No comparta este medicamento con nadie. Qu sucede si me olvido de una dosis? Si se olvida una dosis, tmela lo antes posible. Si es casi la hora de la prxima dosis, tome slo esa dosis. No tome dosis adicionales o dobles. Qu puede interactuar con este medicamento? Esta medicina puede interactuar con los siguientes medicamentos: aspirina o medicamentos tipo aspirina ciertos medicamentos para infecciones micticas tales como quetoconazol e itraconazol ciertos medicamentos para convulsiones tales como carbamazepina y fenitona ciertos medicamentos que tratan o previenen cogulos sanguneos, como warfarina, enoxaparina y dalteparina claritromicina los [place] CanadaAINE, medicamentos para el dolor o inflamacin, como ibuprofeno o naproxeno rifampicina ritonavir hierba de [place] Puede ser que esta lista no menciona todas las posibles interacciones. Informe a su profesional de Beazer Homesla salud de Ingram Micro Inctodos los productos a base de hierbas, medicamentos de Parajeventa libre o suplementos nutritivos que est tomando. Si usted fuma, consume bebidas alcohlicas  o si utiliza drogas ilegales, indqueselo tambin a su  profesional de [employer] Homes. Algunas sustancias pueden interactuar con su medicamento. A qu debo estar atento al usar PPL Corporation? Visite a su mdico o a su profesional de la salud para revisar regularmente su evolucin. Notifique a su mdico o a su profesional de la salud y busque tratamiento mdico de emergencia si desarrolla problemas respiratorios; cambios en la visin; dolor en el pecho; dolor de cabeza repentino e intenso; dolor, hinchazn, calor en la pierna; problemas para hablar; entumecimiento o debilidad repentina en el rostro, el brazo o la pierna. Estos pueden ser signos de que su afeccin ha [employer]. Si va a someterse a una operacin u otro procedimiento, informe a su mdico que est tomando PPL Corporation. Qu efectos secundarios puedo tener al Boston Scientific este medicamento? Efectos secundarios que debe informar a su mdico o a Producer, television/film/video de la salud tan pronto como sea posible: Therapist, art, como erupcin cutnea, picazn o urticarias, e hinchazn de la cara, los labios o la lengua signos y sntomas de Landscape architect, tales como heces con sangre o de color negro y aspecto alquitranado; [place] de color rojo o Child psychotherapist oscuro; escupir sangre o Biomedical engineer que tiene el aspecto de granos de caf molido; Regulatory affairs officer rojas en la piel; sangrado o moretones inusuales en los ojos, las encas o la nariz Puede ser que esta lista no menciona todos los posibles efectos secundarios. Comunquese a su mdico por asesoramiento mdico Hewlett-[employer]ard. Usted puede informar los efectos secundarios a la FDA por telfono al 1-800-FDA-1088. Dnde debo guardar mi medicina? Mantngala fuera del alcance de los nios. Gurdela a Sanmina-SCI, entre 20 y 25 grados C (67 y 75 grados F). Deseche todo el medicamento que no haya utilizado, despus de la fecha de vencimiento. ATENCIN: Este folleto es un resumen. Puede ser que no cubra toda la posible informacin. Si usted tiene preguntas  acerca de esta medicina, consulte con su mdico, su farmacutico o su profesional de Radiographer, therapeutic.  2018 Elsevier/Gold Standard (2016-09-04 00:00:00)

## 2018-01-21 NOTE — Progress Notes (Signed)
HPI Mr. Trinidad CuretMerinocabrera returns today for followup. He is a very pleasant 77 year old man with a history of complete heart block, status post permanent pacemaker insertion, and hypertension.  In the interim he has done well. He denies chest pain or shortness of breath.  No peripheral edema. No syncope. His blood pressure has been a little high and he denies medical non-compliance.  He has recently traveled to [place] to see his family. No syncope. No palpitations.  No Known Allergies   Current Outpatient Medications  Medication Sig Dispense Refill  . allopurinol (ZYLOPRIM) 300 MG tablet Take 300 mg by mouth daily.    . carvedilol (COREG) 25 MG tablet TAKE 1 AND 1/2 TABLETS BY MOUTH TWICE DAILY WITH A MEAL 270 tablet 2  . hydrochlorothiazide (HYDRODIURIL) 25 MG tablet Take 1 tablet (25 mg total) by mouth daily. Please call and schedule December follow up [phone] 90 tablet 1  . predniSONE (DELTASONE) 20 MG tablet Take 2 tablets (40 mg total) by mouth daily with breakfast. 10 tablet 0   No current facility-administered medications for this visit.      Past Medical History:  Diagnosis Date  . Arthritis   . Blockage of coronary artery of heart (HCC)   . Complete heart block (HCC)   . Depression   . Gastritis    Hx of   . Glaucoma   . History of heart attack 1992  . HTN (hypertension)   . Pacemaker   . Syncope   . Unspecified essential hypertension     ROS:   All systems reviewed and negative except as noted in the HPI.   Past Surgical History:  Procedure Laterality Date  . PACEMAKER INSERTION       Family History  Problem Relation Age of Onset  . Colon cancer Neg Hx      Social History   Socioeconomic History  . Marital status: Widowed    Spouse name: Not on file  . Number of children: 1  . Years of education: Not on file  . Highest education level: Not on file  Occupational History  . Not on file  Social Needs  . Financial resource strain: Not on  file  . Food insecurity:    Worry: Not on file    Inability: Not on file  . Transportation needs:    Medical: Not on file    Non-medical: Not on file  Tobacco Use  . Smoking status: Current Every Day Smoker  . Smokeless tobacco: Never Used  Substance and Sexual Activity  . Alcohol use: Yes    Comment: Little usage  . Drug use: No  . Sexual activity: Not on file  Lifestyle  . Physical activity:    Days per week: Not on file    Minutes per session: Not on file  . Stress: Not on file  Relationships  . Social connections:    Talks on phone: Not on file    Gets together: Not on file    Attends religious service: Not on file    Active member of club or organization: Not on file    Attends meetings of clubs or organizations: Not on file    Relationship status: Not on file  . Intimate partner violence:    Fear of current or ex partner: Not on file    Emotionally abused: Not on file    Physically abused: Not on file    Forced sexual activity: Not on file  Other  Topics Concern  . Not on file  Social History Narrative  . Not on file     BP 124/72   Pulse 92   Ht 5\' 8"  (1.727 m)   Wt 185 lb (83.9 kg)   BMI 28.13 kg/m   Physical Exam:  Well appearing 77 yo man, NAD HEENT: Unremarkable Neck:  No JVD, no thyromegally Lymphatics:  No adenopathy Back:  No CVA tenderness Lungs:  Clear with no wheezes HEART:  Regular rate rhythm, no murmurs, no rubs, no clicks Abd:  soft, positive bowel sounds, no organomegally, no rebound, no guarding Ext:  2 plus pulses, no edema, no cyanosis, no clubbing Skin:  No rashes no nodules Neuro:  CN II through XII intact, motor grossly intact  EKG - NSR with p synchronous  Ventricular pacing  DEVICE  Normal device function.  See PaceArt for details.   Assess/Plan: 1. CHB - he is s/p PPM insertion and is asymptomatic. We will follow. 2. PPM - his St. Jude DDD PM is working normally. He has about 2 years of battery longevity. 3. HTN - his  blood pressure is well controlled today and we will not adjust his meds. Through his interpreter he denies non-compliance. 4. PAF - interogation of his PPM demonstrates that he is having some atrial fib. I have recommended he start Eliquis 5 bid.  Leonia Reeves.D.

## 2018-03-09 DIAGNOSIS — I10 Essential (primary) hypertension: Secondary | ICD-10-CM | POA: Diagnosis not present

## 2018-03-09 DIAGNOSIS — F17200 Nicotine dependence, unspecified, uncomplicated: Secondary | ICD-10-CM | POA: Diagnosis not present

## 2018-03-09 DIAGNOSIS — M545 Low back pain: Secondary | ICD-10-CM | POA: Diagnosis not present

## 2018-03-09 DIAGNOSIS — R7303 Prediabetes: Secondary | ICD-10-CM | POA: Diagnosis not present

## 2018-03-09 DIAGNOSIS — M1A00X0 Idiopathic chronic gout, unspecified site, without tophus (tophi): Secondary | ICD-10-CM | POA: Diagnosis not present

## 2018-03-09 DIAGNOSIS — I480 Paroxysmal atrial fibrillation: Secondary | ICD-10-CM | POA: Insufficient documentation

## 2018-03-09 DIAGNOSIS — G8929 Other chronic pain: Secondary | ICD-10-CM | POA: Diagnosis not present

## 2018-03-09 DIAGNOSIS — M1711 Unilateral primary osteoarthritis, right knee: Secondary | ICD-10-CM | POA: Diagnosis not present

## 2018-03-17 ENCOUNTER — Other Ambulatory Visit: Payer: Self-pay | Admitting: *Deleted

## 2018-03-17 NOTE — Telephone Encounter (Addendum)
Received a phone call from Joseph ChuteKaretha Austin, who states she is a family friend of the pt, and was just given permission from the refill dept to speak to me in the Anticoagulation Clinic regarding the pt's Eliquis. I asked if the pt was with her and she stated yes so I asked all the pt's info and looked at chart and this person is not listed is the chart but his daughter is so I asked to speak with the dtr.  She said ok and then the dtr was on another telephone line on another phone not with the person I was speaking to so I told her that this is against policy and that I would have to call the pt with our interpreter to assist because having three different phone lines was not great communication and that the friend on the phone was not listed for me to speak to. So they understood that HIPPA would be breached if I spoke to the friend about the pt and that is against standards. Will use an interpreter to call the pt & they understood.    Pt is on Eliquis 5mg  BID; pt is 77 yr old, Wt-83.9kg, Crea-0.79 [facility] & last seen by Dr. Ladona Ridgelaylor.    Interpreter Joseph Austin called the pt's number for me and unable to reach on home number so tried cell number & it states not a valid number. So tried the dtr's number & left a message to callback at [phone] so we can take care of what the pt needs. Will await for the pt to call back.   [facility]ed [employer] & [name] & we were able to speak with [name] on the phone and she states her dad is going out the country to [place] in 04/18/18 and will need a 2 month supply. She states they picked up the last refill on July 10th for a 1 month supply. Therefore, let her know that I would call to see if I could send in it for a 2 month. [name] and she stated that I did not need to send in anything in at this time because on 03/20/18 she will put the pt in for a 3 month supply refill on her Eliquis. Dtr was updated  and will call back 03/24/18 to speak to me to follow-up. Will await & send a staff message to self regarding this.

## 2018-03-20 DIAGNOSIS — R7303 Prediabetes: Secondary | ICD-10-CM | POA: Diagnosis not present

## 2018-03-20 DIAGNOSIS — I10 Essential (primary) hypertension: Secondary | ICD-10-CM | POA: Diagnosis not present

## 2018-03-20 DIAGNOSIS — M1A00X0 Idiopathic chronic gout, unspecified site, without tophus (tophi): Secondary | ICD-10-CM | POA: Diagnosis not present

## 2018-03-24 ENCOUNTER — Encounter: Payer: Self-pay | Admitting: Pharmacist

## 2018-03-24 ENCOUNTER — Telehealth: Payer: Self-pay | Admitting: *Deleted

## 2018-03-24 NOTE — Telephone Encounter (Signed)
03/24/18-called the pt and dtr today using [employer] and instructed them that [facility] Eliquis with a 90 day supply and to call the pharmacy to see if ready for pick up. Pt and dtr advised to call PCP or Dr. Ladona Ridgelaylor with any issues. Pt/dtr will call back with any issues.   Previous conversation that was done under a refill on 03/17/18:  Received a phone call from Shana ChuteKaretha Moore, who states she is a family friend of the pt, and was just given permission from the refill dept to speak to me in the Anticoagulation Clinic regarding the pt's Eliquis. I asked if the pt was with her and she stated yes so I asked all the pt's info and looked at chart and this person is not listed is the chart but his daughter is so I asked to speak with the dtr.  She said ok and then the dtr was on another telephone line on another phone not with the person I was speaking to so I told her that this is against policy and that I would have to call the pt with our interpreter to assist because having three different phone lines was not great communication and that the friend on the phone was not listed for me to speak to. So they understood that HIPPA would be breached if I spoke to the friend about the pt and that is against standards. Will use an interpreter to call the pt & they understood.    Pt is on Eliquis 5mg  BID; pt is 77 yr old, Wt-83.9kg, Crea-0.79 [facility] & last seen by Dr. Ladona Ridgelaylor.    Interpreter Pattricia Bossnnie called the pt's number for me and unable to reach on home number so tried cell number & it states not a valid number. So tried the dtr's number & left a message to callback at [phone] so we can take care of what the pt needs. Will await for the pt to call back.   Called [employer] & [facility] & we were able to speak with [name] on the phone and she states her dad is going out the country  to [place] in 04/18/18 and will need a 2 month supply. She states they picked up the last refill on July 10th for a 1 month supply. Therefore, let her know that I would call to see if I could send in it for a 2 month. [name] and she stated that I did not need to send in anything in at this time because on 03/20/18 she will put the pt in for a 3 month supply refill on her Eliquis. Dtr was updated and will call back 03/24/18 to speak to me to follow-up. Will await & send a staff message to self regarding this.

## 2018-03-24 NOTE — Telephone Encounter (Signed)
Patient    This encounter was created in error - please disregard.

## 2018-03-26 DIAGNOSIS — R05 Cough: Secondary | ICD-10-CM | POA: Diagnosis not present

## 2018-03-26 DIAGNOSIS — R042 Hemoptysis: Secondary | ICD-10-CM | POA: Diagnosis not present

## 2018-04-10 ENCOUNTER — Other Ambulatory Visit: Payer: Self-pay | Admitting: Internal Medicine

## 2018-04-10 DIAGNOSIS — Z950 Presence of cardiac pacemaker: Secondary | ICD-10-CM

## 2018-04-13 DIAGNOSIS — E785 Hyperlipidemia, unspecified: Secondary | ICD-10-CM | POA: Insufficient documentation

## 2018-04-13 DIAGNOSIS — M1611 Unilateral primary osteoarthritis, right hip: Secondary | ICD-10-CM | POA: Diagnosis not present

## 2018-04-13 DIAGNOSIS — E119 Type 2 diabetes mellitus without complications: Secondary | ICD-10-CM | POA: Insufficient documentation

## 2018-04-13 DIAGNOSIS — M545 Low back pain: Secondary | ICD-10-CM | POA: Diagnosis not present

## 2018-04-13 DIAGNOSIS — M25551 Pain in right hip: Secondary | ICD-10-CM | POA: Diagnosis not present

## 2018-04-13 DIAGNOSIS — Z79899 Other long term (current) drug therapy: Secondary | ICD-10-CM | POA: Diagnosis not present

## 2018-04-13 DIAGNOSIS — M4317 Spondylolisthesis, lumbosacral region: Secondary | ICD-10-CM | POA: Diagnosis not present

## 2018-04-13 DIAGNOSIS — I480 Paroxysmal atrial fibrillation: Secondary | ICD-10-CM | POA: Diagnosis not present

## 2018-04-13 DIAGNOSIS — M5136 Other intervertebral disc degeneration, lumbar region: Secondary | ICD-10-CM | POA: Diagnosis not present

## 2018-04-13 DIAGNOSIS — M47816 Spondylosis without myelopathy or radiculopathy, lumbar region: Secondary | ICD-10-CM | POA: Diagnosis not present

## 2018-04-13 DIAGNOSIS — D509 Iron deficiency anemia, unspecified: Secondary | ICD-10-CM | POA: Diagnosis not present

## 2018-04-13 DIAGNOSIS — G8929 Other chronic pain: Secondary | ICD-10-CM | POA: Diagnosis not present

## 2018-04-29 DIAGNOSIS — M4156 Other secondary scoliosis, lumbar region: Secondary | ICD-10-CM | POA: Insufficient documentation

## 2018-04-29 DIAGNOSIS — M48062 Spinal stenosis, lumbar region with neurogenic claudication: Secondary | ICD-10-CM | POA: Insufficient documentation

## 2018-04-29 DIAGNOSIS — M47816 Spondylosis without myelopathy or radiculopathy, lumbar region: Secondary | ICD-10-CM | POA: Diagnosis not present

## 2018-04-29 DIAGNOSIS — M160 Bilateral primary osteoarthritis of hip: Secondary | ICD-10-CM | POA: Diagnosis not present

## 2018-04-30 ENCOUNTER — Telehealth: Payer: Self-pay | Admitting: Internal Medicine

## 2018-04-30 NOTE — Telephone Encounter (Signed)
   Primary Cardiologist: Lewayne BuntingGregg Taylor, MD  Chart reviewed as part of pre-operative protocol coverage. Simple dental extractions are considered low risk procedures per guidelines and generally do not require any specific cardiac clearance. It is also generally accepted that for simple extractions and dental cleanings, there is no need to interrupt blood thinner therapy.   The above statement applies if this a single tooth extraction only. If this is a multiple extraction procedure, please re-send request for further clearance.   I will route this recommendation to the requesting party via Epic fax function and remove from pre-op pool.  Please call with questions.  Georgie ChardJill Raegyn Renda, NP 04/30/2018, 1:43 PM

## 2018-04-30 NOTE — Telephone Encounter (Signed)
New Message:       [facility] Pre-operative Risk Assessment    Request for surgical clearance:  What type of surgery is being performed? tooth extraction  1. When is this surgery scheduled? TBD   What type of clearance is required (medical clearance vs. Pharmacy clearance to hold med vs. Both)? Medical  2. Are there any medications that need to be held prior to surgery and how long? apixaban (ELIQUIS) 5 MG TABS tablet  3. Practice name and name of physician performing surgery? [facility]   4. What is your office phone number [phone]   7.   What is your office fax number [phone]  8.   Anesthesia type (None, local, MAC, general) ? General    Charmain M Stokes 04/30/2018, 11:42 AM  _________________________________________________________________   (provider comments below)

## 2018-07-22 ENCOUNTER — Ambulatory Visit (INDEPENDENT_AMBULATORY_CARE_PROVIDER_SITE_OTHER): Payer: Medicare Other | Admitting: *Deleted

## 2018-07-22 DIAGNOSIS — I442 Atrioventricular block, complete: Secondary | ICD-10-CM

## 2018-07-22 DIAGNOSIS — Z950 Presence of cardiac pacemaker: Secondary | ICD-10-CM

## 2018-07-22 LAB — CUP PACEART INCLINIC DEVICE CHECK
Battery Remaining Longevity: 7 mo
Battery Voltage: 2.68 V
Brady Statistic RA Percent Paced: 64 %
Brady Statistic RV Percent Paced: 98 %
Date Time Interrogation Session: 20191205111358
Implantable Lead Implant Date: 20110310
Implantable Lead Implant Date: 20110310
Implantable Lead Location: 753859
Implantable Lead Location: 753860
Implantable Pulse Generator Implant Date: 20110310
Lead Channel Impedance Value: 337.5 Ohm
Lead Channel Impedance Value: 475 Ohm
Lead Channel Pacing Threshold Amplitude: 0.75 V
Lead Channel Pacing Threshold Pulse Width: 0.5 ms
Lead Channel Pacing Threshold Pulse Width: 0.5 ms
Lead Channel Sensing Intrinsic Amplitude: 2.9 mV
Lead Channel Setting Pacing Amplitude: 1 V
Lead Channel Setting Pacing Amplitude: 1.625
Lead Channel Setting Pacing Pulse Width: 0.5 ms
Lead Channel Setting Sensing Sensitivity: 4 mV
MDC IDC MSMT LEADCHNL RA PACING THRESHOLD AMPLITUDE: 0.625 V
MDC IDC PG SERIAL: 7120024
Pulse Gen Model: 2210

## 2018-07-22 NOTE — Progress Notes (Signed)
Pacemaker check in clinic, translation provided by video interpreter. Normal device function. Thresholds, sensing, impedances consistent with previous measurements. Device programmed to maximize longevity. 210 mode switches (<1%)--A lead noise per available EGMs and likely true AF per rates/duration, longest 1 day 11hrs, +Eliqus. No high ventricular rates noted. 6 A noise reversions--EGMs show mostly A lead noise, though one EGM shows noise on both A and V leads (not sensed on V). Patient was in [place] at the time of this event and denies any testing/procedures or symptoms. Device programmed at appropriate safety margins; able to reproduce A lead noise with isometrics--reduced RA sensitivity to 1.680mV. Histogram distribution appropriate for patient activity level. Estimated longevity 7.9 months (battery current 10uA, voltage 2.68V, ERI at 2.60V). Patient declines remote monitoring. Patient education completed. ROV with DC in 2 months for battery check and ROV with GT in 01/2019.  Patient reports waking up some mornings with bloody gums. He has not seen a dentist recently. Patient reports symptoms started after he started taking Eliquis in 01/2018. He reports a history of gastritis, though he denies any other signs of bleeding, including frank red blood noted in stools or emesis, coffee-ground emesis, or tarry stools. He states that he has had tests done but is not sure which tests or what the results were. Encouraged patient to discuss with PCP and advised I will route message to Dr. Ladona Ridgelaylor and his nurse for any additional recommendations.

## 2018-07-26 DIAGNOSIS — M48062 Spinal stenosis, lumbar region with neurogenic claudication: Secondary | ICD-10-CM | POA: Diagnosis not present

## 2018-07-26 DIAGNOSIS — M47816 Spondylosis without myelopathy or radiculopathy, lumbar region: Secondary | ICD-10-CM | POA: Diagnosis not present

## 2018-07-26 DIAGNOSIS — M4156 Other secondary scoliosis, lumbar region: Secondary | ICD-10-CM | POA: Diagnosis not present

## 2018-07-30 ENCOUNTER — Telehealth: Payer: Self-pay

## 2018-07-30 DIAGNOSIS — I480 Paroxysmal atrial fibrillation: Secondary | ICD-10-CM

## 2018-07-30 DIAGNOSIS — R072 Precordial pain: Secondary | ICD-10-CM

## 2018-07-30 NOTE — Telephone Encounter (Signed)
-----   Message from Nigel SloopEmily C Miliano, RN sent at 07/22/2018 12:46 PM EST ----- Pt reports intermittent bleeding from gums since he started Eliquis in 01/2018. Requests recommendations.

## 2018-07-30 NOTE — Telephone Encounter (Signed)
Call placed to wife.   Pt has been having some bleeding since starting Eliquis, wife could not give a description d/t language barrier.  Advised to have Pt get lab work per Dr. Ladona Ridgelaylor (CBC).  Pt will come in December 17 in the afternoon for CBC.

## 2018-08-03 ENCOUNTER — Other Ambulatory Visit: Payer: Medicare Other

## 2018-08-03 DIAGNOSIS — I480 Paroxysmal atrial fibrillation: Secondary | ICD-10-CM

## 2018-08-04 DIAGNOSIS — M4156 Other secondary scoliosis, lumbar region: Secondary | ICD-10-CM | POA: Diagnosis not present

## 2018-08-04 DIAGNOSIS — M47816 Spondylosis without myelopathy or radiculopathy, lumbar region: Secondary | ICD-10-CM | POA: Diagnosis not present

## 2018-08-04 LAB — CBC WITH DIFFERENTIAL/PLATELET
Basophils Absolute: 0.1 10*3/uL (ref 0.0–0.2)
Basos: 1 %
EOS (ABSOLUTE): 0.2 10*3/uL (ref 0.0–0.4)
Eos: 3 %
Hematocrit: 35.5 % — ABNORMAL LOW (ref 37.5–51.0)
Hemoglobin: 12.1 g/dL — ABNORMAL LOW (ref 13.0–17.7)
Immature Grans (Abs): 0 10*3/uL (ref 0.0–0.1)
Immature Granulocytes: 0 %
Lymphocytes Absolute: 1.3 10*3/uL (ref 0.7–3.1)
Lymphs: 17 %
MCH: 33.1 pg — AB (ref 26.6–33.0)
MCHC: 34.1 g/dL (ref 31.5–35.7)
MCV: 97 fL (ref 79–97)
Monocytes Absolute: 0.8 10*3/uL (ref 0.1–0.9)
Monocytes: 11 %
Neutrophils Absolute: 5.1 10*3/uL (ref 1.4–7.0)
Neutrophils: 68 %
Platelets: 217 10*3/uL (ref 150–450)
RBC: 3.66 x10E6/uL — ABNORMAL LOW (ref 4.14–5.80)
RDW: 13.1 % (ref 12.3–15.4)
WBC: 7.5 10*3/uL (ref 3.4–10.8)

## 2018-08-13 MED ORDER — RIVAROXABAN 20 MG PO TABS: 20.0000 mg | ORAL_TABLET | Freq: Every day | ORAL | 11 refills | Status: DC

## 2018-08-13 NOTE — Telephone Encounter (Signed)
Pt and [facility] to discuss results of lab work and discuss medication change.  Used [employer] translator to speak with family.  Pt c/o daily bleeding gums with brushing his teeth.  No bruising or red or dark stools.  Pt also c/o of some chest pain transiently.    Discussed changing from Eliquis to xarelto.  Pt has taken Eliquis today.  Will start Xarelto tomorrow.  Pt given samples.  Follow up appt made with Dr. Ladona Ridgelaylor for February to review med change/chest pain.  Will discuss chest pain with Dr. Lynn Itoaylor-Pt with history of CAD and MI.

## 2018-08-13 NOTE — Telephone Encounter (Signed)
Pt and wife coming to office today at 2:00 pm to discuss medication change

## 2018-08-16 DIAGNOSIS — E119 Type 2 diabetes mellitus without complications: Secondary | ICD-10-CM | POA: Diagnosis not present

## 2018-08-16 DIAGNOSIS — M1A00X0 Idiopathic chronic gout, unspecified site, without tophus (tophi): Secondary | ICD-10-CM | POA: Diagnosis not present

## 2018-08-16 DIAGNOSIS — E785 Hyperlipidemia, unspecified: Secondary | ICD-10-CM | POA: Diagnosis not present

## 2018-08-16 DIAGNOSIS — Z23 Encounter for immunization: Secondary | ICD-10-CM | POA: Diagnosis not present

## 2018-08-16 DIAGNOSIS — Z1211 Encounter for screening for malignant neoplasm of colon: Secondary | ICD-10-CM | POA: Diagnosis not present

## 2018-08-16 DIAGNOSIS — I480 Paroxysmal atrial fibrillation: Secondary | ICD-10-CM | POA: Diagnosis not present

## 2018-08-16 DIAGNOSIS — M545 Low back pain: Secondary | ICD-10-CM | POA: Diagnosis not present

## 2018-08-16 DIAGNOSIS — G8929 Other chronic pain: Secondary | ICD-10-CM | POA: Diagnosis not present

## 2018-08-17 NOTE — Telephone Encounter (Signed)
Per Dr. Ladona Ridgelaylor- Order lexiscan myoview for c/o of chest pain.  Left CVM per DPR with use of medical interpreter.  Will place order.

## 2018-08-20 ENCOUNTER — Telehealth: Payer: Self-pay | Admitting: Internal Medicine

## 2018-08-20 MED ORDER — DABIGATRAN ETEXILATE MESYLATE 150 MG PO CAPS: 150.0000 mg | ORAL_CAPSULE | Freq: Two times a day (BID) | ORAL | 11 refills | Status: DC

## 2018-08-20 NOTE — Telephone Encounter (Signed)
Returned call to Pt daughter with assistance of interpreter.    Asked if Pt has had recent dentist appt.  Per Daughter, Pt saw dentist in September when he had a tooth extracted but nothing else was wrong with his mouth (asking d/t Pt with gum bleeding).  Spoke with pharmacist-will try Pradaxa d/t Pt with increased bleeding with Xarelto (worse than with Eliquis).  Pt will start Pradaxa 150 mg BID.  Left samples at front.  Pt has stress test on 08/25/2018.  Will check with family at that time to see if tolerating.  If not tolerating warfarin is last option.

## 2018-08-20 NOTE — Telephone Encounter (Signed)
Pt c/o medication issue:  1. Name of Medication: rivaroxaban (XARELTO) 20 MG TABS tablet  2. How are you currently taking this medication (dosage and times per day)?--  3. Are you having a reaction (difficulty breathing--STAT)?n/a  4. What is your medication issue?  Patient's daughter called stating her father's medication was changed last week, and ever since then he is having a lot of bleeding.  He is bleeding more from his gums.  He is also having nose bleeds which he never had before.   Please return call with a Spanish interpreter.

## 2018-08-23 ENCOUNTER — Telehealth (HOSPITAL_COMMUNITY): Payer: Self-pay | Admitting: *Deleted

## 2018-08-23 NOTE — Telephone Encounter (Signed)
Patient's daughter, per DPR, with telephonic interpretation, given detailed instructions per Myocardial Perfusion Study Information Sheet for the test on 08/25/17. Patient notified to arrive 15 minutes early and that it is imperative to arrive on time for appointment to keep from having the test rescheduled.  If you need to cancel or reschedule your appointment, please call the office within 24 hours of your appointment. . Patient verbalized understanding. Joseph Austin

## 2018-08-25 ENCOUNTER — Ambulatory Visit (HOSPITAL_COMMUNITY): Payer: Medicare Other | Attending: Cardiovascular Disease

## 2018-08-25 ENCOUNTER — Ambulatory Visit (INDEPENDENT_AMBULATORY_CARE_PROVIDER_SITE_OTHER): Payer: Medicare Other | Admitting: Internal Medicine

## 2018-08-25 ENCOUNTER — Encounter: Payer: Self-pay | Admitting: Internal Medicine

## 2018-08-25 VITALS — BP 132/80 | HR 61 | Ht 68.0 in | Wt 185.0 lb

## 2018-08-25 DIAGNOSIS — R072 Precordial pain: Secondary | ICD-10-CM

## 2018-08-25 DIAGNOSIS — Z950 Presence of cardiac pacemaker: Secondary | ICD-10-CM

## 2018-08-25 DIAGNOSIS — I442 Atrioventricular block, complete: Secondary | ICD-10-CM

## 2018-08-25 LAB — MYOCARDIAL PERFUSION IMAGING
CHL CUP RESTING HR STRESS: 61 {beats}/min
LV sys vol: 44 mL
LVDIAVOL: 110 mL (ref 62–150)
Peak HR: 86 {beats}/min
SDS: 0
SRS: 4
SSS: 4
TID: 0.99

## 2018-08-25 MED ORDER — APIXABAN 5 MG PO TABS: 5.0000 mg | ORAL_TABLET | Freq: Two times a day (BID) | ORAL | 3 refills | Status: DC

## 2018-08-25 MED ORDER — REGADENOSON 0.4 MG/5ML IV SOLN
0.4000 mg | Freq: Once | INTRAVENOUS | Status: AC
  Administered 2018-08-25: 0.4 mg via INTRAVENOUS

## 2018-08-25 MED ORDER — TECHNETIUM TC 99M TETROFOSMIN IV KIT
10.2000 | PACK | Freq: Once | INTRAVENOUS | Status: AC | PRN
  Administered 2018-08-25: 10.2 via INTRAVENOUS
  Filled 2018-08-25: qty 11

## 2018-08-25 MED ORDER — TECHNETIUM TC 99M TETROFOSMIN IV KIT
32.6000 | PACK | Freq: Once | INTRAVENOUS | Status: AC | PRN
  Administered 2018-08-25: 32.6 via INTRAVENOUS
  Filled 2018-08-25: qty 33

## 2018-08-25 NOTE — Patient Instructions (Addendum)
Medication Instructions:   Your physician has recommended you make the following change in your medication:   1.  Stop Pradaxa  2.  Restart Eliquis 5 mg one tablet by mouth twice a day   Labwork: None ordered.  Testing/Procedures: None ordered.  Follow-Up: Your physician wants you to follow-up in: per recall.   Any Other Special Instructions Will Be Listed Below (If Applicable).  If you need a refill on your cardiac medications before your next appointment, please call your pharmacy.

## 2018-08-25 NOTE — Telephone Encounter (Signed)
Pt with OV 08/25/2018

## 2018-08-25 NOTE — Progress Notes (Signed)
HPI [name] returns today for evaluation of bleeding from his gums since starting an OAC. He has taken Eliquis, Xarelto and Pradaxa. The bleeding occurs at night when he awakens from sleep. He has poor dentition and clearly has gum disease. In addition he has had some chest pain and is pending a stress test. He denies hematemesis or melena.  No Known Allergies   Current Outpatient Medications  Medication Sig Dispense Refill  . acetaminophen (TYLENOL) 325 MG tablet Take 325 mg by mouth 2 (two) times daily as needed.    [name] Kitchen allopurinol (ZYLOPRIM) 300 MG tablet Take 300 mg by mouth daily.    . carvedilol (COREG) 25 MG tablet TAKE 1 AND 1/2 TABLETS BY MOUTH TWICE DAILY WITH MEALS 270 tablet 3  . dabigatran (PRADAXA) 150 MG CAPS capsule Take 1 capsule (150 mg total) by mouth 2 (two) times daily. 60 capsule 11  . hydrochlorothiazide (HYDRODIURIL) 25 MG tablet Take 1 tablet (25 mg total) by mouth daily. Please call and schedule December follow up [phone] 90 tablet 1   No current facility-administered medications for this visit.      Past Medical History:  Diagnosis Date  . Arthritis   . Blockage of coronary artery of heart (HCC)   . Complete heart block (HCC)   . Depression   . Gastritis    Hx of   . Glaucoma   . History of heart attack 1992  . HTN (hypertension)   . Pacemaker   . Syncope   . Unspecified essential hypertension     ROS:   All systems reviewed and negative except as noted in the HPI.   Past Surgical History:  Procedure Laterality Date  . PACEMAKER INSERTION       Family History  Problem Relation Age of Onset  . Colon cancer Neg Hx      Social History   Socioeconomic History  . Marital status: Widowed    Spouse name: Not on file  . Number of children: 1  . Years of education: Not on file  . Highest education level: Not on file  Occupational History  . Not on file  Social Needs  . Financial resource strain: Not on file  . Food  insecurity:    Worry: Not on file    Inability: Not on file  . Transportation needs:    Medical: Not on file    Non-medical: Not on file  Tobacco Use  . Smoking status: Current Every Day Smoker  . Smokeless tobacco: Never Used  Substance and Sexual Activity  . Alcohol use: Yes    Comment: Little usage  . Drug use: No  . Sexual activity: Not on file  Lifestyle  . Physical activity:    Days per week: Not on file    Minutes per session: Not on file  . Stress: Not on file  Relationships  . Social connections:    Talks on phone: Not on file    Gets together: Not on file    Attends religious service: Not on file    Active member of club or organization: Not on file    Attends meetings of clubs or organizations: Not on file    Relationship status: Not on file  . Intimate partner violence:    Fear of current or ex partner: Not on file    Emotionally abused: Not on file    Physically abused: Not on file    Forced sexual activity:  Not on file  Other Topics Concern  . Not on file  Social History Narrative  . Not on file     BP 132/80   Pulse 61   Ht 5\' 8"  (1.727 m)   Wt 185 lb (83.9 kg)   BMI 28.13 kg/m   Physical Exam:  Well appearing NAD HEENT: Unremarkable except for poor dentition. Neck:  No JVD, no thyromegally Lymphatics:  No adenopathy Back:  No CVA tenderness Lungs:  Clear with no wheezes HEART:  Regular rate rhythm, no murmurs, no rubs, no clicks Abd:  soft, positive bowel sounds, no organomegally, no rebound, no guarding Ext:  2 plus pulses, no edema, no cyanosis, no clubbing Skin:  No rashes no nodules Neuro:  CN II through XII intact, motor grossly intact  EKG - nsr with AV pacing  DEVICE  Not interrogated  Assess/Plan: 1. Bleeding - he is having gingival bleeding from poor dentition. He has not had melena. He will restart Eliquis as he bled less on this medication than xarelto or pradaxa.  2. CHB - he is AV pacing 3. PAF - he is in NSR today 4.  Preoperative eval - he is pending a myleogram. He may hold his Eliquis for 5 days prior to his procedure.   Leonia Reeves.D.

## 2018-09-15 ENCOUNTER — Telehealth: Payer: Self-pay | Admitting: *Deleted

## 2018-09-15 NOTE — Telephone Encounter (Signed)
   [facility] Pre-operative Risk Assessment    Request for surgical clearance:  1. What type of surgery is being performed? COLONOSCOPY/EGD   2. When is this surgery scheduled? TBD   3. What type of clearance is required (medical clearance vs. Pharmacy clearance to hold med vs. Both)? BOTH  4. Are there any medications that need to be held prior to surgery and how long? HOLD ELIQUIS X 3 DAYS PRIOR   5. Practice name and name of physician performing surgery? [facility]; DR. LE   6. What is your office phone number [phone]    7.   What is your office fax number [phone]  8.   Anesthesia type (None, local, MAC, general) ? PROPOFOL ?    Julaine Hua 09/15/2018, 10:29 AM  _________________________________________________________________   (provider comments below)

## 2018-09-15 NOTE — Telephone Encounter (Addendum)
Patient with diagnosis of afib on Eliquis for anticoagulation.    Procedure: colonoscopy/EGD Date of procedure: TBD  CHADS2-VASc score of  5 (CHF, HTN, AGE, DM2, stroke/tia x 2, CAD, AGE, male)  Per office protocol, patient can hold Eliquis for up to 2 days prior to procedure.

## 2018-09-21 NOTE — Telephone Encounter (Signed)
I was able to reach his wife today, who requested a Nurse, learning disability. I could not get a translator today for a phone call. In the meantime, I will forward a note to Dr. Ladona Ridgel to clarify his clearance for colonoscopy.

## 2018-09-21 NOTE — Telephone Encounter (Signed)
He may proceed with colonoscopy.

## 2018-09-22 NOTE — Telephone Encounter (Signed)
   Primary Cardiologist: Lewayne Bunting, MD  Chart reviewed as part of pre-operative protocol coverage. Given past medical history and time since last visit, based on ACC/AHA guidelines, Rhonda Ledsome would be at acceptable risk for the planned procedure without further cardiovascular testing.  He may hold his Eliquis 2 days prior to procedure.    I will route this recommendation to the requesting party via Epic fax function and remove from pre-op pool.  Please call with questions.  Nada Boozer, NP 09/22/2018, 2:59 PM

## 2018-10-06 ENCOUNTER — Encounter: Payer: Medicare Other | Admitting: Internal Medicine

## 2018-11-17 ENCOUNTER — Other Ambulatory Visit: Payer: Self-pay | Admitting: Internal Medicine

## 2018-11-17 DIAGNOSIS — Z950 Presence of cardiac pacemaker: Secondary | ICD-10-CM

## 2018-11-17 MED ORDER — CARVEDILOL 25 MG PO TABS: ORAL_TABLET | ORAL | 3 refills | Status: DC

## 2018-11-17 MED ORDER — HYDROCHLOROTHIAZIDE 25 MG PO TABS: 25.0000 mg | ORAL_TABLET | Freq: Every day | ORAL | 3 refills | Status: DC

## 2018-11-17 NOTE — Telephone Encounter (Signed)
Pt's medication was sent to pt's pharmacy as requested. Confirmation received.  °

## 2019-02-01 ENCOUNTER — Encounter: Payer: Medicare Other | Admitting: Internal Medicine

## 2019-02-08 ENCOUNTER — Telehealth: Payer: Self-pay

## 2019-02-08 NOTE — Progress Notes (Signed)
Electrophysiology Office Note Date: 02/10/2019  ID:  Joseph Austin, DOB [birth year], MRN [id number]  PCP: Kristopher Glee., MD Electrophysiologist: Lovena Le  CC: Pacemaker follow-up  Joseph Austin is a 78 y.o. male seen today for Dr Lovena Le.  He is seen with the assistance of a [employer] employed [place] interpreter.  He presents today for routine electrophysiology followup.  Since last being seen in our clinic, the patient reports doing very well. He remains active at home and is primarily limited by his back.   He denies chest pain, palpitations, dyspnea, PND, orthopnea, nausea, vomiting, dizziness, syncope, edema, weight gain, or early satiety.  Device History: STJ dual chamber PPM implanted 2011 for complete heart block   Past Medical History:  Diagnosis Date  . Arthritis   . Blockage of coronary artery of heart ([place])   . Complete heart block ([place])   . Depression   . Gastritis    Hx of   . Glaucoma   . History of heart attack 1992  . HTN (hypertension)   . Pacemaker   . Syncope   . Unspecified essential hypertension    Past Surgical History:  Procedure Laterality Date  . PACEMAKER INSERTION      Current Outpatient Medications  Medication Sig Dispense Refill  . allopurinol (ZYLOPRIM) 300 MG tablet Take 300 mg by mouth daily.    [name] Kitchen apixaban (ELIQUIS) 5 MG TABS tablet Take 1 tablet (5 mg total) by mouth 2 (two) times daily. 180 tablet 3  . carvedilol (COREG) 25 MG tablet TAKE 1 AND 1/2 TABLETS BY MOUTH TWICE DAILY WITH MEALS (Patient taking differently: Take 25 mg by mouth 2 (two) times daily with a meal. ) 270 tablet 3  . hydrochlorothiazide (HYDRODIURIL) 25 MG tablet Take 1 tablet (25 mg total) by mouth daily. Please call and schedule December follow up [phone] 90 tablet 3  . rosuvastatin (CRESTOR) 20 MG tablet Take 20 mg by mouth daily.     [name] Kitchen acetaminophen (TYLENOL) 500 MG tablet Take 1,000 mg by mouth every 6 (six) hours as needed for moderate pain or headache.    .  diphenhydrAMINE-APAP, sleep, (TYLENOL PM EXTRA STRENGTH) 50-1000 MG/30ML LIQD Take 15 mLs by mouth at bedtime as needed (sleep).    . dorzolamide-timolol (COSOPT) 22.3-6.8 MG/ML ophthalmic solution Place 1 drop into both eyes 2 (two) times daily.    [name] Kitchen omeprazole (PRILOSEC) 40 MG capsule Take 40 mg by mouth daily.     No current facility-administered medications for this visit.     Allergies:   Patient has no known allergies.   Social History: Social History   Socioeconomic History  . Marital status: Widowed    Spouse name: Not on file  . Number of children: 1  . Years of education: Not on file  . Highest education level: Not on file  Occupational History  . Not on file  Social Needs  . Financial resource strain: Not on file  . Food insecurity    Worry: Not on file    Inability: Not on file  . Transportation needs    Medical: Not on file    Non-medical: Not on file  Tobacco Use  . Smoking status: Current Every Day Smoker  . Smokeless tobacco: Never Used  Substance and Sexual Activity  . Alcohol use: Yes    Comment: Little usage  . Drug use: No  . Sexual activity: Not on file  Lifestyle  . Physical activity    Days per week: Not  on file    Minutes per session: Not on file  . Stress: Not on file  Relationships  . Social Musicianconnections    Talks on phone: Not on file    Gets together: Not on file    Attends religious service: Not on file    Active member of club or organization: Not on file    Attends meetings of clubs or organizations: Not on file    Relationship status: Not on file  . Intimate partner violence    Fear of current or ex partner: Not on file    Emotionally abused: Not on file    Physically abused: Not on file    Forced sexual activity: Not on file  Other Topics Concern  . Not on file  Social History Narrative  . Not on file    Family History: Family History  Problem Relation Age of Onset  . Colon cancer Neg Hx      Review of Systems: All  other systems reviewed and are otherwise negative except as noted above.   Physical Exam: VS:  BP 130/74   Pulse 69   Ht 5\' 8"  (1.727 m)   Wt 194 lb 3.2 oz (88.1 kg)   SpO2 97%   BMI 29.53 kg/m  , BMI Body mass index is 29.53 kg/m.  GEN- The patient is well appearing, alert and oriented x 3 today.   HEENT: normocephalic, atraumatic; sclera clear, conjunctiva pink; hearing intact; oropharynx clear; neck supple  Lungs- Clear to ausculation bilaterally, normal work of breathing.  No wheezes, rales, rhonchi Heart- Regular rate and rhythm (paced) GI- soft, non-tender, non-distended, bowel sounds present  Extremities- no clubbing, cyanosis, or edema  MS- no significant deformity or atrophy Skin- warm and dry, no rash or lesion; PPM pocket well healed Psych- euthymic mood, full affect Neuro- strength and sensation are intact  PPM Interrogation- reviewed in detail today,  See PACEART report  EKG:  EKG is not ordered today.  Recent Labs: 02/09/2019: BUN 29; Creatinine, Ser 0.87; Hemoglobin 13.4; Platelets 181; Potassium 4.2; Sodium 140   Wt Readings from Last 3 Encounters:  02/09/19 194 lb 3.2 oz (88.1 kg)  08/25/18 185 lb (83.9 kg)  08/25/18 185 lb (83.9 kg)     Other studies Reviewed: Additional studies/ records that were reviewed today include: Dr Lubertha Basqueaylor's office notes   Assessment and Plan:  1.  Complete heart block Pacemaker at [facility] He is pacemaker dependent today Risks, benefits to gen change reviewed with patient who wishes to proceed. Hold Eliquis for 2 doses prior to procedure.   2.  Persistent atrial fibrillation He has been in persistent AF for several months but is asymptomatic.  Continue Eliquis for CHADS2VASC of 3 Will not pursue rhythm control at this time with lack of symptoms.  3.  HTN Stable No change required today    Current medicines are reviewed at length with the patient today.   The patient does not have concerns regarding his medicines.  The  following changes were made today:  none  Labs/ tests ordered today include: BMET, CBC Orders Placed This Encounter  Procedures  . CBC  . Basic Metabolic Panel (BMET)     Disposition:   Follow up with Dr Ladona Ridgelaylor after gen change     Signed, Gypsy BalsamAmber Damya Comley, NP 02/10/2019 8:36 AM  [facility] [address] [place] [address] [phone] (office) [phone] (fax)

## 2019-02-08 NOTE — Telephone Encounter (Signed)

## 2019-02-09 ENCOUNTER — Encounter: Payer: Self-pay | Admitting: Nurse Practitioner

## 2019-02-09 ENCOUNTER — Ambulatory Visit (INDEPENDENT_AMBULATORY_CARE_PROVIDER_SITE_OTHER): Payer: Medicare Other | Admitting: Nurse Practitioner

## 2019-02-09 ENCOUNTER — Encounter (INDEPENDENT_AMBULATORY_CARE_PROVIDER_SITE_OTHER): Payer: Self-pay

## 2019-02-09 ENCOUNTER — Other Ambulatory Visit: Payer: Self-pay

## 2019-02-09 VITALS — BP 130/74 | HR 69 | Ht 68.0 in | Wt 194.2 lb

## 2019-02-09 DIAGNOSIS — I480 Paroxysmal atrial fibrillation: Secondary | ICD-10-CM | POA: Diagnosis not present

## 2019-02-09 DIAGNOSIS — I10 Essential (primary) hypertension: Secondary | ICD-10-CM

## 2019-02-09 DIAGNOSIS — Z01812 Encounter for preprocedural laboratory examination: Secondary | ICD-10-CM

## 2019-02-09 DIAGNOSIS — I442 Atrioventricular block, complete: Secondary | ICD-10-CM

## 2019-02-09 NOTE — Patient Instructions (Signed)
Medication Instructions:  none If you need a refill on your cardiac medications before your next appointment, please call your pharmacy.   Lab work:TODAY CBC BMET If you have labs (blood work) drawn today and your tests are completely normal, you will receive your results only by: [name] Kitchen MyChart Message (if you have MyChart) OR . A paper copy in the mail If you have any lab test that is abnormal or we need to change your treatment, we will call you to review the results.  Testing/Procedures: GENERATOR CHANGE 7/1 Dr Lovena Le  Follow-Up:10-14 days from 7/1 DEVICE CLINIC                     91 days from 7/1 with Dr Lovena Le   At [facility], you and your health needs are our priority.  As part of our continuing mission to provide you with exceptional heart care, we have created designated Provider Care Teams.  These Care Teams include your primary Cardiologist (physician) and Advanced Practice Providers (APPs -  Physician Assistants and Nurse Practitioners) who all work together to provide you with the care you need, when you need it. .   Any Other Special Instructions Will Be Listed Below (If Applicable). LAST DOSE OF ELIQUIS ON Tuesday MORNING Please arrive at [facility] at 6:30 am Do not eat or drink after midnight the night prior to the procedure Do not take any medications the morning of the test Will need someone to drive you home at discharge  [place] Saturday @ 10:30 am [facility] [place]

## 2019-02-10 ENCOUNTER — Telehealth: Payer: Self-pay

## 2019-02-10 LAB — CUP PACEART INCLINIC DEVICE CHECK
Battery Remaining Longevity: 0 mo
Battery Voltage: 2.59 V
Brady Statistic RA Percent Paced: 33 %
Brady Statistic RV Percent Paced: 96 %
Date Time Interrogation Session: 20200624160658
Implantable Lead Implant Date: 20110310
Implantable Lead Implant Date: 20110310
Implantable Lead Location: 753859
Implantable Lead Location: 753860
Implantable Pulse Generator Implant Date: 20110310
Lead Channel Impedance Value: 362.5 Ohm
Lead Channel Impedance Value: 412.5 Ohm
Lead Channel Pacing Threshold Amplitude: 0.625 V
Lead Channel Pacing Threshold Amplitude: 0.75 V
Lead Channel Pacing Threshold Pulse Width: 0.5 ms
Lead Channel Pacing Threshold Pulse Width: 0.5 ms
Lead Channel Sensing Intrinsic Amplitude: 1 mV
Lead Channel Sensing Intrinsic Amplitude: 6.1 mV
Lead Channel Setting Pacing Amplitude: 1 V
Lead Channel Setting Pacing Amplitude: 1.625
Lead Channel Setting Pacing Pulse Width: 0.5 ms
Lead Channel Setting Sensing Sensitivity: 4 mV
Pulse Gen Model: 2210
Pulse Gen Serial Number: 7120024

## 2019-02-10 LAB — CBC
Hematocrit: 39.5 % (ref 37.5–51.0)
Hemoglobin: 13.4 g/dL (ref 13.0–17.7)
MCH: 32.6 pg (ref 26.6–33.0)
MCHC: 33.9 g/dL (ref 31.5–35.7)
MCV: 96 fL (ref 79–97)
Platelets: 181 10*3/uL (ref 150–450)
RBC: 4.11 x10E6/uL — ABNORMAL LOW (ref 4.14–5.80)
RDW: 12.8 % (ref 11.6–15.4)
WBC: 8.2 10*3/uL (ref 3.4–10.8)

## 2019-02-10 LAB — BASIC METABOLIC PANEL
BUN/Creatinine Ratio: 33 — ABNORMAL HIGH (ref 10–24)
BUN: 29 mg/dL — ABNORMAL HIGH (ref 8–27)
CO2: 19 mmol/L — ABNORMAL LOW (ref 20–29)
Calcium: 9.8 mg/dL (ref 8.6–10.2)
Chloride: 101 mmol/L (ref 96–106)
Creatinine, Ser: 0.87 mg/dL (ref 0.76–1.27)
GFR calc Af Amer: 98 mL/min/{1.73_m2} (ref >59)
GFR calc non Af Amer: 84 mL/min/{1.73_m2} (ref >59)
Glucose: 233 mg/dL — ABNORMAL HIGH (ref 65–99)
Potassium: 4.2 mmol/L (ref 3.5–5.2)
Sodium: 140 mmol/L (ref 134–144)

## 2019-02-10 NOTE — Telephone Encounter (Signed)
I spoke to patient's family member and informed her that patient needs to arrive at noon on 7/1, instead of earlier that morning. She verbalized understanding.

## 2019-02-12 ENCOUNTER — Other Ambulatory Visit (HOSPITAL_COMMUNITY)
Admission: RE | Admit: 2019-02-12 | Discharge: 2019-02-12 | Disposition: A | Discharge status: Home / Self Care | Payer: Medicare Other | Source: Ambulatory Visit | Attending: Internal Medicine | Admitting: Internal Medicine

## 2019-02-12 DIAGNOSIS — Z1159 Encounter for screening for other viral diseases: Secondary | ICD-10-CM | POA: Diagnosis present

## 2019-02-13 LAB — SARS CORONAVIRUS 2 (TAT 6-24 HRS): SARS Coronavirus 2: NEGATIVE

## 2019-02-16 ENCOUNTER — Encounter (HOSPITAL_COMMUNITY)
Admission: RE | Disposition: A | Discharge status: Home / Self Care | Payer: Self-pay | Source: Home / Self Care | Attending: Internal Medicine

## 2019-02-16 ENCOUNTER — Ambulatory Visit (HOSPITAL_COMMUNITY)
Admission: RE | Admit: 2019-02-16 | Discharge: 2019-02-16 | Disposition: A | Discharge status: Home / Self Care | Payer: Medicare Other | Attending: Internal Medicine | Admitting: Internal Medicine

## 2019-02-16 ENCOUNTER — Other Ambulatory Visit: Payer: Self-pay

## 2019-02-16 DIAGNOSIS — I442 Atrioventricular block, complete: Secondary | ICD-10-CM | POA: Diagnosis not present

## 2019-02-16 DIAGNOSIS — Z45010 Encounter for checking and testing of cardiac pacemaker pulse generator [battery]: Secondary | ICD-10-CM | POA: Diagnosis present

## 2019-02-16 HISTORY — PX: PPM GENERATOR CHANGEOUT: EP1233

## 2019-02-16 LAB — SURGICAL PCR SCREEN
MRSA, PCR: NEGATIVE
Staphylococcus aureus: POSITIVE — AB

## 2019-02-16 SURGERY — PPM GENERATOR CHANGEOUT

## 2019-02-16 MED ORDER — FENTANYL CITRATE (PF) 100 MCG/2ML IJ SOLN
INTRAMUSCULAR | Status: DC | PRN
  Administered 2019-02-16: 25 ug via INTRAVENOUS

## 2019-02-16 MED ORDER — CEFAZOLIN SODIUM-DEXTROSE 2-4 GM/100ML-% IV SOLN
2.0000 g | INTRAVENOUS | Status: AC
  Administered 2019-02-16: 2 g via INTRAVENOUS

## 2019-02-16 MED ORDER — SODIUM CHLORIDE 0.9 % IV SOLN
INTRAVENOUS | Status: DC
  Administered 2019-02-16: 12:00:00 via INTRAVENOUS

## 2019-02-16 MED ORDER — MIDAZOLAM HCL 5 MG/5ML IJ SOLN
INTRAMUSCULAR | Status: DC | PRN
  Administered 2019-02-16: 2 mg via INTRAVENOUS

## 2019-02-16 MED ORDER — SODIUM CHLORIDE 0.9 % IV SOLN
INTRAVENOUS | Status: AC
  Filled 2019-02-16: qty 2

## 2019-02-16 MED ORDER — SODIUM CHLORIDE 0.9 % IV SOLN
80.0000 mg | INTRAVENOUS | Status: AC
  Administered 2019-02-16: 14:00:00 80 mg
  Filled 2019-02-16: qty 2

## 2019-02-16 MED ORDER — LIDOCAINE HCL (PF) 1 % IJ SOLN
INTRAMUSCULAR | Status: AC
  Filled 2019-02-16: qty 60

## 2019-02-16 MED ORDER — MUPIROCIN 2 % EX OINT
TOPICAL_OINTMENT | CUTANEOUS | Status: AC
  Administered 2019-02-16: 1 via TOPICAL
  Filled 2019-02-16: qty 22

## 2019-02-16 MED ORDER — FENTANYL CITRATE (PF) 100 MCG/2ML IJ SOLN
INTRAMUSCULAR | Status: AC
  Filled 2019-02-16: qty 2

## 2019-02-16 MED ORDER — CHLORHEXIDINE GLUCONATE 4 % EX LIQD: 60.0000 mL | Freq: Once | CUTANEOUS | Status: DC

## 2019-02-16 MED ORDER — LIDOCAINE HCL (PF) 1 % IJ SOLN
INTRAMUSCULAR | Status: DC | PRN
  Administered 2019-02-16: 60 mL

## 2019-02-16 MED ORDER — MUPIROCIN 2 % EX OINT
1.0000 "application " | TOPICAL_OINTMENT | Freq: Once | CUTANEOUS | Status: AC
  Administered 2019-02-16: 1 via TOPICAL

## 2019-02-16 MED ORDER — MIDAZOLAM HCL 5 MG/5ML IJ SOLN
INTRAMUSCULAR | Status: AC
  Filled 2019-02-16: qty 5

## 2019-02-16 MED ORDER — CEFAZOLIN SODIUM-DEXTROSE 2-4 GM/100ML-% IV SOLN
INTRAVENOUS | Status: AC
  Filled 2019-02-16: qty 100

## 2019-02-16 SURGICAL SUPPLY — 4 items
CABLE SURGICAL S-101-97-12 (CABLE) ×3 IMPLANT
PACEMAKER ASSURITY DR-RF (Pacemaker) ×2 IMPLANT
PAD PRO RADIOLUCENT 2001M-C (PAD) ×3 IMPLANT
TRAY PACEMAKER INSERTION (PACKS) ×3 IMPLANT

## 2019-02-16 NOTE — Discharge Instructions (Signed)
Mupirocin nasal ointment Qu es este medicamento? El compuesto MUPIROCINA CALCIO es un antibitico. Se utiliza en la parte interna de la nariz para tratar infecciones causadas por ciertas bacterias. Ayuda a evitar as Financial risk analyst de la infeccin a pacientes y a los trabajadores de la salud en los instituciones durante brotes. Este medicamento puede ser utilizado para otros usos; si tiene alguna pregunta consulte con su proveedor de atencin mdica o con su farmacutico. MARCAS COMUNES: Bactroban Qu le debo informar a mi profesional de la salud antes de tomar este medicamento? Necesita saber si usted presenta alguno de los siguientes problemas o situaciones:  una Risk analyst o inusual a la mupirocina, a otros medicamentos, alimentos, colorantes o conservantes  si est embarazada o buscando quedar embarazada  si est amamantando a un beb Cmo debo [employer]? Este medicamento slo se debe aplicar en la parte interna de la [place]. Siga las instrucciones de la etiqueta del [place]. Lvese las manos antes y despus de usarlo. Exprima la mitad del contenido del tubo desechable en un orificio nasal y luego la otra mitad en el otro orificio nasal. Tpese la Doran Durand, presionando a cada lado de la misma con los dedos para que la pomada se esparza bien por la Lawyer. No utilice el medicamento con una frecuencia mayor a la indicada. Complete todo el tratamiento con el medicamento segn lo haya recetado su mdico o su profesional de la salud aun si considera que su problema ha mejorado. Hable con su pediatra para informarse acerca del uso de este medicamento en nios. Puede requerir atencin especial. Sobredosis: Pngase en contacto inmediatamente con un centro toxicolgico o una sala de urgencia si usted cree que haya tomado demasiado medicamento. ATENCIN: [employer] es solo para usted. No comparta este medicamento con nadie. Qu sucede si me olvido de una dosis? Si olvida  una dosis, aplquela lo antes posible. Si es casi la hora de la prxima dosis, aplique slo esa dosis. No use dosis adicionales o dobles. Qu puede interactuar con este medicamento? No se esperan interacciones. No utilice otros productos nasales sin consultar a su mdico o a su profesional de [employer]	[place]. Puede ser que esta lista no menciona todas las posibles interacciones. Informe a su profesional de KB Home	[place] de [employer] productos a base de hierbas, medicamentos de [place] o suplementos nutritivos que est tomando. Si usted fuma, consume bebidas alcohlicas o si utiliza drogas ilegales, indqueselo tambin a su profesional de [employer] Home	[place]. Algunas sustancias pueden interactuar con su medicamento. A qu debo estar atento al usar [employer]? Si tiene la nariz muy irritada o siente ardor o escozor debido al uso del medicamento, suspenda el uso y consulte a su mdico o a su profesional de Technical sales engineer. Evite el contacto de este medicamento con sus ojos. Si esto ocurriese, enjuguelos con abundante agua fra del grifo. Qu efectos secundarios puedo tener al [employer] este medicamento? Efectos secundarios que debe informar a su mdico o a Barrister's clerk de la salud tan pronto como sea posible:  irritacin, ardor, escozor o dolores severos Efectos secundarios que, por lo general, no requieren Geophysical data processor (debe informarlos a su mdico o a Barrister's clerk de la salud si persisten o si son molestos):  alteracin en el sentido del gusto  tos  dolor de cabeza  picazn cutnea  dolor de garganta  nariz tapada o goteo de la nariz Puede ser que esta lista no menciona todos los posibles efectos secundarios. Comunquese a su mdico por  asesoramiento mdico Hewlett-Packardsobre los efectos secundarios. Usted puede informar los efectos secundarios a la FDA por telfono al 1-800-FDA-1088. Dnde debo guardar mi medicina? Mantngala fuera del alcance de los nios. Gurdela a Sanmina-SCItemperatura ambiente, entre 15 y 30 grados  C (7859 y 3686 grados F). No la refrigere. Cada tubo de pomada es para una sola aplicacin en cada orificio nasal. Deschelo despus de usarlo. ATENCIN: Este folleto es un resumen. Puede ser que no cubra toda la posible informacin. Si usted tiene preguntas acerca de esta medicina, consulte con su mdico, su farmacutico o su profesional de Radiographer, therapeuticla salud.  2020 Elsevier/Gold Standard (2014-09-26 00:00:00)  Post procedure care instructions Cambio de batera del marcapasos, cuidados posteriores Pacemaker Battery Change, Care After Lea esta informacin sobre cmo cuidarse despus del procedimiento. Su mdico tambin podr darle indicaciones ms especficas. Comunquese con su mdico si tiene problemas o preguntas. Qu puedo esperar despus del procedimiento? Despus del procedimiento, es comn tener los siguientes sntomas:  Art therapistDolor en el lugar donde se insert el marcapasos.  Hinchazn en el lugar donde se insert el marcapasos. Siga estas indicaciones en su casa: Cuidados de la incisin   Mantenga la incisin limpia y Magazine features editorseca. ? No tome baos de inmersin, no nade ni use el jacuzzi hasta que el mdico lo autorice. ? Puede ducharse un da despus del procedimiento o segn las indicaciones del mdico. ? Seque bien el rea con una toalla limpia dando golpecitos. No frote la zona. Puede ocasionarle una hemorragia.  Siga las indicaciones del mdico acerca del cuidado de la incisin. Haga lo siguiente: ? Lvese las manos con agua y jabn antes de Multimedia programmercambiar las vendas (vendajes). Use desinfectante para manos si no dispone de [place] y [place]jabn. ? Cambie los vendajes como se lo haya indicado el mdico. ? No retire los puntos (suturas), la goma para cerrar la piel o las tiras Springdaleadhesivas. Es posible que estos cierres cutneos Conservation officer, naturedeban permanecer en la piel durante 2semanas o ms tiempo. Si los bordes de las tiras [address]adhesivas empiezan a despegarse y Scientific laboratory technicianenroscarse, puede recortar los que estn sueltos. No retire las tiras  Agilent Technologiesadhesivas por completo a menos que el mdico se lo indique.  Controle todos los das la zona de la incisin para detectar signos de infeccin. Est atento a los siguientes signos: ? Aumento del enrojecimiento, de la hinchazn o del dolor. ? Mayor presencia de lquido o [place]. ? Calor. ? Pus o mal olor. Actividad  No levante nada que pese ms de 10lb (4,5kg) hasta que el mdico le diga que es seguro [place] Parkhacerlo.  Durante las primeras 2semanas o el tiempo que le haya indicado el mdico: ? No eleve el brazo por encima del hombro. ? Dow ChemicalCuando levante los brazos por encima de la cabeza, hgalo suavemente. Puede levantar los brazos para peinarse. ? Evite el ejercicio fsico extenuante.  Pregntele al mdico cundo puede hacer lo siguiente: ? Reanudar sus actividades normales. ? Regresar a la escuela o al Aleen Campitrabajo. ? Reanudar la actividad sexual. Comida y bebida  Siga una dieta cardiosaludable. Esto debe incluir gran cantidad de frutas y verduras frescas, cereales integrales, lcteos con bajo contenido de [place]grasa y protenas [place]ras, como el pollo y el pescado.  Limite el consumo de alcohol a no ms de 1 medida por da si es mujer y no est Orthoptistembarazada, y 2 medidas por da si es hombre. Una medida equivale a 12oz (355ml) de cerveza, 5oz (148ml) de vino o 1oz (44ml) de bebidas alcohlicas de alta graduacin.  Revise los  ingredientes y la informacin nutricional que figuran en los envases de alimentos y bebidas. Evite los siguientes alimentos: ? Los que tienen gran contenido de sal (sodio). ? Los que tienen gran cantidad de grasas saturadas, como la carne roja o los lcteos enteros. ? Los que tienen un alto contenido de grasa trans, como los alimentos fritos. ? Los alimentos y las bebidas con alto contenido de International aid/development workerazcar. Estilo de vida  No consuma ningn producto que contenga nicotina o tabaco, como cigarrillos y Administrator, Civil Servicecigarrillos electrnicos. Si necesita ayuda para dejar de fumar, consulte al  mdico.  Tome medidas para controlar su peso.  Realice actividad fsica con regularidad. Tenga un objetivo de 150minutos de ejercicio moderado (como caminar o Education administratorpracticar yoga) o 75minutos de ejercicio vigoroso (como correr o Programmer, systemsnadar) todas las semanas.  Controle otros problemas de salud que tenga, como por ejemplo, diabetes o presin arterial alta. Pregntele al mdico cmo puede manejar estas afecciones. Instrucciones generales  No conduzca durante las 24horas posteriores al procedimiento si le dieron un medicamento para ayudarlo a que se relaje (sedante).  Tome los medicamentos de venta libre y los recetados solamente como se lo haya indicado el mdico.  Evite hacer presin sobre el lugar donde tiene colocado el [place]asos.  Si necesita una Health visitorresonancia magntica (RM) despus de que le coloquen el marcapasos, avsele al mdico que la indica que usted tiene este dispositivo.  Evite la exposicin prolongada y cercana a dispositivos elctricos que tienen campos magnticos potentes. Estos incluyen los siguientes: ? Telfonos celulares. No los guarde en bolsillos que estn cerca del marcapasos y trate de usar el odo opuesto al Charter Communicationsmarcapasos. ? Reproductores de MP3. ? Electrodomsticos, como microondas. ? Detectores de Amgen Incmetales. ? Generadores elctricos. ? Cables de alta tensin.  Concurra a todas las visitas de [address]segu[address] como se lo haya indicado el mdico. Esto es importante. Comunquese con un mdico si:  Advertising account executiveiente dolor en el lugar de la incisin que no se alivia con medicamentos recetados o de Sales promotion account executiveventa libre.  En el lugar de la incisin o a su alrededor, nota lo siguiente: ? Aumento del enrojecimiento, de la hinchazn o del dolor. ? Lquido o sangre. ? Calor al tacto. ? Pus o mal olor.  Tiene fiebre.  Tiene palpitaciones breves e intermitentes, sensacin de desvanecimiento o cualquier sntoma que crea que puede estar relacionado con el corazn. Solicite ayuda de inmediato si:  Tiene  Theatre managerun dolor en el pecho que es diferente del dolor que siente en el lugar del [place]as[place].  Observa una lnea roja que se extiende por arriba o por debajo del lugar de la incisin.  Le falta el aire.  Tiene palpitaciones o latidos cardacos irregulares.  Tiene una sensacin de desvanecimiento que no desaparece rpidamente.  Se desmaya o tiene mareos.  Su pulso disminuye de repente o aumenta rpidamente, y no se normaliza.  Aumenta de peso y se le hinchan las piernas y los tobillos. Resumen  Despus del procedimiento, es normal tener dolor e hinchazn en el lugar donde se insert el marcapasos.  Mantenga la incisin limpia y [place]seca. Siga las indicaciones del mdico acerca del cuidado de la incisin.  Controle Immunologistel lugar de la incisin todos los [address]  [id number] para detectar signos de infeccin, como ms dolor o hinchazn, pus o mal Praeselolor, Airline pilotcalor, o secrecin de lquido y [place]angre.  Evite la actividad fsica extenuante y no levante el brazo izquierdo por encima del hombro durante 2semanas, o durante el plazo que le haya indicado el mdico. Esta informacin no tiene  como fin reemplazar el consejo del mdico. Asegrese de hacerle al mdico cualquier pregunta que tenga. Document Released: 05/25/2013 Document Revised: 05/14/2017 Document Reviewed: 05/14/2017 Elsevier Patient Education  2020 [employer]vier [employer]  Keep incision clean and dry for 10 days. No driving for 2 days.  You can remove outer dressing tomorrow. Leave steri-strips (little pieces of tape) on until seen in the office for wound check appointment. Call the office [phone]) for redness, drainage, swelling, or fever.  Do not resume your Eliquis until Saturday 02/19/2019 morning  Pacemaker Battery Change, Care After This sheet gives you information about how to care for yourself after your procedure. Your health care provider may also give you more specific instructions. If you have problems or questions, contact your health care provider. What can I  expect after the procedure? After your procedure, it is common to have:  Pain or soreness at the site where the pacemaker was inserted.  Swelling at the site where the pacemaker was inserted. Follow these instructions at home: Incision care   Keep the incision clean and dry. ? Do not take baths, swim, or use a hot tub until your health care provider approves. ? You may shower the day after your procedure, or as directed by your health care provider. ? Pat the area dry with a clean towel. Do not rub the area. This may cause bleeding.  Follow instructions from your health care provider about how to take care of your incision. Make sure you: ? Wash your hands with soap and water before you change your bandage (dressing). If soap and water are not available, use hand sanitizer. ? Change your dressing as told by your health care provider. ? Leave stitches (sutures), skin glue, or adhesive strips in place. These skin closures may need to stay in place for 2 weeks or longer. If adhesive strip edges start to loosen and curl up, you may trim the loose edges. Do not remove adhesive strips completely unless your health care provider tells you to do that.  Check your incision area every day for signs of infection. Check for: ? More redness, swelling, or pain. ? More fluid or blood. ? Warmth. ? Pus or a bad smell. Activity  Do not lift anything that is heavier than 10 lb (4.5 kg) until your health care provider says it is okay to do so.  For the first 2 weeks, or as long as told by your health care provider: ? Avoid lifting your left arm higher than your shoulder. ? Be gentle when you move your arms over your head. It is okay to raise your arm to comb your hair. ? Avoid strenuous exercise.  Ask your health care provider when it is okay to: ? Resume your normal activities. ? Return to work or school. ? Resume sexual activity. Eating and drinking  Eat a heart-healthy diet. This should  include plenty of fresh fruits and vegetables, whole grains, low-fat dairy products, and lean protein like chicken and fish.  Limit alcohol intake to no more than 1 drink a day for non-pregnant women and 2 drinks a day for men. One drink equals 12 oz of beer, 5 oz of wine, or 1 oz of hard liquor.  Check ingredients and nutrition facts on packaged foods and beverages. Avoid the following types of food: ? Food that is high in salt (sodium). ? Food that is high in saturated fat, like full-fat dairy or red meat. ? Food that is high in trans fat, like  fried food. ? Food and drinks that are high in sugar. Lifestyle  Do not use any products that contain nicotine or tobacco, such as cigarettes and e-cigarettes. If you need help quitting, ask your health care provider.  Take steps to manage and control your weight.  Get regular exercise. Aim for 150 minutes of moderate-intensity exercise (such as walking or yoga) or 75 minutes of vigorous exercise (such as running or swimming) each week.  Manage other health problems, such as diabetes or high blood pressure. Ask your health care provider how you can manage these conditions. General instructions  Do not drive for 24 hours after your procedure if you were given a medicine to help you relax (sedative).  Take over-the-counter and prescription medicines only as told by your health care provider.  Avoid putting pressure on the area where the pacemaker was placed.  If you need an MRI after your pacemaker has been placed, be sure to tell the health care provider who orders the MRI that you have a pacemaker.  Avoid close and prolonged exposure to electrical devices that have strong magnetic fields. These include: ? Cell phones. Avoid keeping them in a pocket near the pacemaker, and try using the ear opposite the pacemaker. ? MP3 players. ? Household appliances, like microwaves. ? Metal detectors. ? Electric generators. ? High-tension wires.  Keep  all follow-up visits as directed by your health care provider. This is important. Contact a health care provider if:  You have pain at the incision site that is not relieved by over-the-counter or prescription medicines.  You have any of these around your incision site or coming from it: ? More redness, swelling, or pain. ? Fluid or blood. ? Warmth to the touch. ? Pus or a bad smell.  You have a fever.  You feel brief, occasional palpitations, light-headedness, or any symptoms that you think might be related to your heart. Get help right away if:  You experience chest pain that is different from the pain at the pacemaker site.  You develop a red streak that extends above or below the incision site.  You experience shortness of breath.  You have palpitations or an irregular heartbeat.  You have light-headedness that does not go away quickly.  You faint or have dizzy spells.  Your pulse suddenly drops or increases rapidly and does not return to normal.  You begin to gain weight and your legs and ankles swell. Summary  After your procedure, it is common to have pain, soreness, and some swelling where the pacemaker was inserted.  Make sure to keep your incision clean and dry. Follow instructions from your health care provider about how to take care of your incision.  Check your incision every day for signs of infection, such as more pain or swelling, pus or a bad smell, warmth, or leaking fluid and blood.  Avoid strenuous exercise and lifting your left arm higher than your shoulder for 2 weeks, or as long as told by your health care provider. This information is not intended to replace advice given to you by your health care provider. Make sure you discuss any questions you have with your health care provider. Document Released: 05/25/2013 Document Revised: 07/17/2017 Document Reviewed: 06/26/2016 Elsevier Patient Education  2020 [employer].

## 2019-02-16 NOTE — H&P (Signed)
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   Dx        Referred by Andreas Blowerabeza, Yuri M., MD   Reason for Visit        Additional Documentation   Vitals:       BP 130/74       Pulse 69       Ht 5\' 8"  (1.727 m)       Wt 88.1 kg       SpO2 97%       BMI 29.53 kg/m       BSA 2.06 m             More Vitals    Flowsheets:        Anthropometrics,       MEWS Score,       Method of Visit     Encounter Info:        Billing Info,       History,       Allergies,       Detailed Report          All Notes      Progress Notes by Marily LenteSeiler, Amber K, NP at 02/09/2019 12:00 PM   Author: Marily LenteSeiler, Amber K, NP Author Type: Nurse Practitioner Filed: 02/10/2019  8:39 AM  Note Status: Signed Cosign: Cosign Not Required Encounter Date: 02/09/2019  Editor: Marily LenteSeiler, Amber K, NP (Nurse Practitioner)                untitled image        Electrophysiology Office Note  Date: 02/10/2019     ID:  Joseph Austin, DOB 02/23/1941, MRN [id number]     PCP: Andreas Blowerabeza, Yuri M., MD  Electrophysiologist: Ladona Ridgelaylor     CC: Pacemaker follow-up     Joseph Austin is a 78 y.o. male seen today for Dr Ladona Ridgelaylor.  He is seen with the assistance of a [employer] employed [place] interpreter.  He presents today for routine electrophysiology followup.  Since last being seen in our clinic, the patient reports doing very well. He remains active at home and is primarily limited by his back.   He denies chest pain, palpitations, dyspnea, PND, orthopnea, nausea, vomiting, dizziness, syncope, edema, weight gain, or early satiety.     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   Arthritis        .   Blockage of coronary artery of heart (HCC)        .   Complete heart block (HCC)        .   Depression        .   Gastritis            Hx of     .   Glaucoma        .   History of heart attack   1992    .   HTN (hypertension)        .   Pacemaker        .   Syncope        .  Unspecified essential hypertension                Past Surgical History:    Procedure   Laterality   Date    .   PACEMAKER INSERTION                        Current Outpatient Medications    Medication   Sig   Dispense   Refill    .   allopurinol (ZYLOPRIM) 300 MG tablet   Take 300 mg by mouth daily.            [name] Kitchen   apixaban (ELIQUIS) 5 MG TABS tablet   Take 1 tablet (5 mg total) by mouth 2 (two) times daily.   180 tablet   3    .   carvedilol (COREG) 25 MG tablet   TAKE 1 AND 1/2 TABLETS BY MOUTH TWICE DAILY WITH MEALS (Patient taking differently: Take 25 mg by mouth 2 (two) times daily with a meal. )   270 tablet   3    .   hydrochlorothiazide (HYDRODIURIL) 25 MG tablet   Take 1 tablet (25 mg total) by mouth daily. Please call and schedule December follow up [phone]   90 tablet   3    .   rosuvastatin (CRESTOR) 20 MG tablet   Take 20 mg by mouth daily.             [name] Kitchen   acetaminophen (TYLENOL) 500 MG tablet   Take 1,000 mg by mouth every 6 (six) hours as needed for moderate pain or headache.            .   diphenhydrAMINE-APAP, sleep, (TYLENOL PM EXTRA STRENGTH) 50-1000 MG/30ML LIQD   Take 15 mLs by mouth at bedtime as needed (sleep).            .   dorzolamide-timolol (COSOPT) 22.3-6.8 MG/ML ophthalmic solution   Place 1 drop into both eyes 2 (two) times daily.            [name] Kitchen   omeprazole (PRILOSEC) 40 MG capsule   Take 40 mg by mouth daily.                No current  facility-administered medications for this visit.          Allergies:   Patient has no known allergies.      Social History:   Social History             Socioeconomic History    .   Marital status:   Widowed            Spouse name:   Not on file    .   Number of children:   1    .   Years of education:   Not on file    .   Highest education level:   Not on file    Occupational History    .   Not on file    Social Needs    .   Financial resource strain:   Not on file    .   Food insecurity            Worry:   Not on file            Inability:   Not on file    .   Transportation needs  Medical:   Not on file            Non-medical:   Not on file    Tobacco Use    .   Smoking status:   Current Every Day Smoker    .   Smokeless tobacco:   Never Used    Substance and Sexual Activity    .   Alcohol use:   Yes            Comment: Little usage    .   Drug use:   No    .   Sexual activity:   Not on file    Lifestyle    .   Physical activity            Days per week:   Not on file            Minutes per session:   Not on file    .   Stress:   Not on file    Relationships    .   Social Leisure centre managerconnections            Talks on phone:   Not on file            Gets together:   Not on file            Attends religious service:   Not on file            Active member of club or organization:   Not on file            Attends meetings of clubs or organizations:   Not on file            Relationship status:   Not on file    .   Intimate partner violence            Fear of current or ex partner:   Not on file            Emotionally abused:   Not on file            Physically abused:   Not on file            Forced sexual activity:    Not on file    Other Topics   Concern    .   Not on file    Social History Narrative    .   Not on file         Family History:        Family History    Problem   Relation   Age of Onset    .   Colon cancer   Neg Hx                Review of Systems:  All other systems reviewed and are otherwise negative except as noted above.        Physical Exam:  VS:  BP 130/74   Pulse 69   Ht 5\' 8"  (1.727 m)   Wt 194 lb 3.2 oz (88.1 kg)   SpO2 97%   BMI 29.53 kg/m  , BMI Body mass index is 29.53 kg/m.     GEN- The patient is well appearing, alert and oriented x 3 today.    HEENT: normocephalic, atraumatic; sclera clear, conjunctiva pink; hearing intact; oropharynx clear; neck supple   Lungs- Clear to ausculation bilaterally, normal work of breathing.  No wheezes, rales, rhonchi  Heart- Regular rate and  rhythm (paced)  GI- soft, non-tender, non-distended, bowel sounds present   Extremities- no clubbing, cyanosis, or edema   MS- no significant deformity or atrophy  Skin- warm and dry, no rash or lesion; PPM pocket well healed  Psych- euthymic mood, full affect  Neuro- strength and sensation are intact     PPM Interrogation- reviewed in detail today,  See PACEART report     EKG:  EKG is not ordered today.     Recent Labs:  02/09/2019: BUN 29; Creatinine, Ser 0.87; Hemoglobin 13.4; Platelets 181; Potassium 4.2; Sodium 140          Wt Readings from Last 3 Encounters:    02/09/19   194 lb 3.2 oz (88.1 kg)    08/25/18   185 lb (83.9 kg)    08/25/18   185 lb (83.9 kg)          Other studies Reviewed:  Additional studies/ records that were reviewed today include: Dr Lubertha Basqueaylor's office notes      Assessment and Plan:     1.  Complete heart block  Pacemaker at ERI  He is pacemaker dependent today  Risks, benefits to gen change reviewed with patient who wishes to proceed. Hold Eliquis for 2  doses prior to procedure.      2.  Persistent atrial fibrillation  He has been in persistent AF for several months but is asymptomatic.   Continue Eliquis for CHADS2VASC of 3  Will not pursue rhythm control at this time with lack of symptoms.     3.  HTN  Stable  No change required today           Current medicines are reviewed at length with the patient today.    The patient does not have concerns regarding his medicines.  The following changes were made today:  none     Labs/ tests ordered today include: BMET, CBC      Orders Placed This Encounter    Procedures    .   CBC    .   Basic Metabolic Panel (BMET)           Disposition:   Follow up with Dr Ladona Ridgelaylor after gen change          Signed,  Gypsy BalsamAmber Seiler, NP  02/10/2019  8:36 AM   [facility]  [address]  [address]  [place] [address]  [phone] (office)

## 2019-02-16 NOTE — Interval H&P Note (Signed)
History and Physical Interval Note:  02/16/2019 12:52 PM  Joseph Austin  has presented today for surgery, with the diagnosis of eri.  The various methods of treatment have been discussed with the patient and family. After consideration of risks, benefits and other options for treatment, the patient has consented to  Procedure(s): PPM GENERATOR CHANGEOUT (N/A) as a surgical intervention.  The patient's history has been reviewed, patient examined, no change in status, stable for surgery.  I have reviewed the patient's chart and labs.  Questions were answered to the patient's satisfaction.     Cristopher [place]

## 2019-02-17 ENCOUNTER — Encounter (HOSPITAL_COMMUNITY): Payer: Self-pay | Admitting: Internal Medicine

## 2019-03-01 ENCOUNTER — Ambulatory Visit (INDEPENDENT_AMBULATORY_CARE_PROVIDER_SITE_OTHER): Payer: Medicare Other | Admitting: *Deleted

## 2019-03-01 ENCOUNTER — Other Ambulatory Visit: Payer: Self-pay

## 2019-03-01 DIAGNOSIS — I442 Atrioventricular block, complete: Secondary | ICD-10-CM

## 2019-03-01 LAB — CUP PACEART INCLINIC DEVICE CHECK
Battery Remaining Longevity: 118 mo
Battery Voltage: 3.05 V
Brady Statistic RA Percent Paced: 0.76 %
Brady Statistic RV Percent Paced: 97 %
Date Time Interrogation Session: 20200714164010
Implantable Lead Implant Date: 20110310
Implantable Lead Implant Date: 20110310
Implantable Lead Location: 753859
Implantable Lead Location: 753860
Implantable Pulse Generator Implant Date: 20200701
Lead Channel Impedance Value: 350 Ohm
Lead Channel Impedance Value: 400 Ohm
Lead Channel Pacing Threshold Amplitude: 0.875 V
Lead Channel Pacing Threshold Pulse Width: 0.5 ms
Lead Channel Sensing Intrinsic Amplitude: 1.3 mV
Lead Channel Setting Pacing Amplitude: 1.125
Lead Channel Setting Pacing Amplitude: 2 V
Lead Channel Setting Pacing Pulse Width: 0.5 ms
Lead Channel Setting Sensing Sensitivity: 4 mV
Pulse Gen Model: 2272
Pulse Gen Serial Number: 3315946

## 2019-03-01 NOTE — Progress Notes (Signed)
Wound check appointment. Steri-strips removed. Wound without redness or edema. Incision edges approximated, wound healing well. Normal device function. Thresholds, sensing, and impedances consistent with implant measurements. Device programmed at chronic outputs. Histogram distribution appropriate for patient and level of activity. 8 AMS entries, longest 5 days + eliquis. AT/AF burden >99%. No high ventricular rates noted. Patient educated about wound care, arm mobility, lifting restrictions. ROV 05/25/19 w. GT.

## 2019-05-18 ENCOUNTER — Ambulatory Visit (INDEPENDENT_AMBULATORY_CARE_PROVIDER_SITE_OTHER): Payer: Medicare Other | Admitting: *Deleted

## 2019-05-18 DIAGNOSIS — I442 Atrioventricular block, complete: Secondary | ICD-10-CM

## 2019-05-18 LAB — CUP PACEART REMOTE DEVICE CHECK
Battery Remaining Longevity: 116 mo
Battery Remaining Percentage: 95.5 %
Battery Voltage: 3.01 V
Brady Statistic AP VP Percent: 0 %
Brady Statistic AP VS Percent: 0 %
Brady Statistic AS VP Percent: 100 %
Brady Statistic AS VS Percent: 0 %
Brady Statistic RA Percent Paced: 1 %
Brady Statistic RV Percent Paced: 97 %
Date Time Interrogation Session: 20200930060015
Implantable Lead Implant Date: 20110310
Implantable Lead Implant Date: 20110310
Implantable Lead Location: 753859
Implantable Lead Location: 753860
Implantable Pulse Generator Implant Date: 20200701
Lead Channel Impedance Value: 380 Ohm
Lead Channel Impedance Value: 400 Ohm
Lead Channel Pacing Threshold Amplitude: 0.75 V
Lead Channel Pacing Threshold Pulse Width: 0.5 ms
Lead Channel Sensing Intrinsic Amplitude: 1.3 mV
Lead Channel Sensing Intrinsic Amplitude: 5.9 mV
Lead Channel Setting Pacing Amplitude: 1 V
Lead Channel Setting Pacing Amplitude: 2 V
Lead Channel Setting Pacing Pulse Width: 0.5 ms
Lead Channel Setting Sensing Sensitivity: 4 mV
Pulse Gen Model: 2272
Pulse Gen Serial Number: 3315946

## 2019-05-24 ENCOUNTER — Encounter: Payer: Self-pay | Admitting: Cardiology

## 2019-05-24 NOTE — Progress Notes (Signed)
Remote pacemaker transmission.   

## 2019-05-25 ENCOUNTER — Other Ambulatory Visit: Payer: Self-pay

## 2019-05-25 ENCOUNTER — Ambulatory Visit (INDEPENDENT_AMBULATORY_CARE_PROVIDER_SITE_OTHER): Payer: Medicare Other | Admitting: Internal Medicine

## 2019-05-25 ENCOUNTER — Encounter: Payer: Self-pay | Admitting: Internal Medicine

## 2019-05-25 VITALS — BP 112/70 | HR 69 | Ht 68.0 in | Wt 195.0 lb

## 2019-05-25 DIAGNOSIS — I480 Paroxysmal atrial fibrillation: Secondary | ICD-10-CM | POA: Diagnosis not present

## 2019-05-25 DIAGNOSIS — I10 Essential (primary) hypertension: Secondary | ICD-10-CM | POA: Diagnosis not present

## 2019-05-25 DIAGNOSIS — Z950 Presence of cardiac pacemaker: Secondary | ICD-10-CM

## 2019-05-25 DIAGNOSIS — I442 Atrioventricular block, complete: Secondary | ICD-10-CM

## 2019-05-25 LAB — CUP PACEART INCLINIC DEVICE CHECK
Battery Remaining Longevity: 117 mo
Battery Voltage: 3.01 V
Brady Statistic RA Percent Paced: 0.67 %
Brady Statistic RV Percent Paced: 97 %
Date Time Interrogation Session: 20201007161226
Implantable Lead Implant Date: 20110310
Implantable Lead Implant Date: 20110310
Implantable Lead Location: 753859
Implantable Lead Location: 753860
Implantable Pulse Generator Implant Date: 20200701
Lead Channel Impedance Value: 350 Ohm
Lead Channel Impedance Value: 400 Ohm
Lead Channel Pacing Threshold Amplitude: 0.875 V
Lead Channel Pacing Threshold Pulse Width: 0.5 ms
Lead Channel Sensing Intrinsic Amplitude: 1.3 mV
Lead Channel Setting Pacing Amplitude: 1.125
Lead Channel Setting Pacing Pulse Width: 0.5 ms
Lead Channel Setting Sensing Sensitivity: 4 mV
Pulse Gen Model: 2272
Pulse Gen Serial Number: 3315946

## 2019-05-25 NOTE — Patient Instructions (Signed)
Medication Instructions:  Your physician recommends that you continue on your current medications as directed. Please refer to the Current Medication list given to you today.  Labwork: None ordered.  Testing/Procedures: None ordered.  Follow-Up: Your physician wants you to follow-up in: 9 months with Dr. Lovena Le.   You will receive a reminder letter in the mail two months in advance. If you don't receive a letter, please call our office to schedule the follow-up appointment.  Remote monitoring is used to monitor your Pacemaker from home. This monitoring reduces the number of office visits required to check your device to one time per year. It allows [place] to keep an eye on the functioning of your device to ensure it is working properly. You are scheduled for a device check from home on 08/24/2019. You may send your transmission at any time that day. If you have a wireless device, the transmission will be sent automatically. After your physician reviews your transmission, you will receive a postcard with your next transmission date.  Any Other Special Instructions Will Be Listed Below (If Applicable).  If you need a refill on your cardiac medications before your next appointment, please call your pharmacy.

## 2019-05-25 NOTE — Progress Notes (Signed)
HPI Mr. Noah Charon returns today for followup. He is a pleasant 78 yo man with a h/o CHB, s/p PPM insertion, HTN, and PAF. He has tolerated his blood thinner. He wants to go back to [place] to visit family but is disappointed that there are no flights.  No Known Allergies   Current Outpatient Medications  Medication Sig Dispense Refill  . acetaminophen (TYLENOL) 500 MG tablet Take 1,000 mg by mouth every 6 (six) hours as needed for moderate pain or headache.    . allopurinol (ZYLOPRIM) 300 MG tablet Take 300 mg by mouth daily.    [name] Kitchen apixaban (ELIQUIS) 5 MG TABS tablet Take 1 tablet (5 mg total) by mouth 2 (two) times daily. 180 tablet 3  . carvedilol (COREG) 25 MG tablet TAKE 1 AND 1/2 TABLETS BY MOUTH TWICE DAILY WITH MEALS (Patient taking differently: Take 25 mg by mouth 2 (two) times daily with a meal. ) 270 tablet 3  . diphenhydrAMINE-APAP, sleep, (TYLENOL PM EXTRA STRENGTH) 50-1000 MG/30ML LIQD Take 15 mLs by mouth at bedtime as needed (sleep).    . dorzolamide-timolol (COSOPT) 22.3-6.8 MG/ML ophthalmic solution Place 1 drop into both eyes 2 (two) times daily.    . hydrochlorothiazide (HYDRODIURIL) 25 MG tablet Take 1 tablet (25 mg total) by mouth daily. Please call and schedule December follow up [phone] 90 tablet 3  . omeprazole (PRILOSEC) 40 MG capsule Take 40 mg by mouth daily.    . rosuvastatin (CRESTOR) 20 MG tablet Take 20 mg by mouth daily.      No current facility-administered medications for this visit.      Past Medical History:  Diagnosis Date  . Arthritis   . Blockage of coronary artery of heart (HCC)   . Complete heart block (HCC)   . Depression   . Gastritis    Hx of   . Glaucoma   . History of heart attack 1992  . HTN (hypertension)   . Pacemaker   . Syncope   . Unspecified essential hypertension     ROS:   All systems reviewed and negative except as noted in the HPI.   Past Surgical History:  Procedure Laterality Date  . PACEMAKER INSERTION     . PPM GENERATOR CHANGEOUT N/A 02/16/2019   Procedure: PPM GENERATOR CHANGEOUT;  Surgeon: Marinus Maw, MD;  Location: [facility] INVASIVE CV LAB;  Service: Cardiovascular;  Laterality: N/A;     Family History  Problem Relation Age of Onset  . Colon cancer Neg Hx      Social History   Socioeconomic History  . Marital status: Widowed    Spouse name: Not on file  . Number of children: 1  . Years of education: Not on file  . Highest education level: Not on file  Occupational History  . Not on file  Social Needs  . Financial resource strain: Not on file  . Food insecurity    Worry: Not on file    Inability: Not on file  . Transportation needs    Medical: Not on file    Non-medical: Not on file  Tobacco Use  . Smoking status: Current Every Day Smoker  . Smokeless tobacco: Never Used  Substance and Sexual Activity  . Alcohol use: Yes    Comment: Little usage  . Drug use: No  . Sexual activity: Not on file  Lifestyle  . Physical activity    Days per week: Not on file    Minutes per session:  Not on file  . Stress: Not on file  Relationships  . Social Herbalist on phone: Not on file    Gets together: Not on file    Attends religious service: Not on file    Active member of club or organization: Not on file    Attends meetings of clubs or organizations: Not on file    Relationship status: Not on file  . Intimate partner violence    Fear of current or ex partner: Not on file    Emotionally abused: Not on file    Physically abused: Not on file    Forced sexual activity: Not on file  Other Topics Concern  . Not on file  Social History Narrative  . Not on file     BP 112/70   Pulse 69   Ht 5\' 8"  (1.727 m)   Wt 195 lb (88.5 kg)   SpO2 97%   BMI 29.65 kg/m   Physical Exam:  Well appearing 78 yo man, NAD HEENT: Unremarkable Neck:  No JVD, no thyromegally Lymphatics:  No adenopathy Back:  No CVA tenderness Lungs:  Clear with no wheezes HEART:  Regular  rate rhythm, no murmurs, no rubs, no clicks Abd:  soft, positive bowel sounds, no organomegally, no rebound, no guarding Ext:  2 plus pulses, no edema, no cyanosis, no clubbing Skin:  No rashes no nodules Neuro:  CN II through XII intact, motor grossly intact  EKG - atrial flutter with ventricular pacing.  DEVICE  Normal device function.  See PaceArt for details.   Assess/Plan: 1. Atrial fib/flutter - he is asymptomatic and his VR is well controlled. 2. CHB - he is asymptomatic s/p PPM insertion. 3. HTN - his bp is good today. He will continue coreg. 4. PPM - his [employer] DDD PM is working normally.  Mikle Bosworth.D.

## 2019-08-24 ENCOUNTER — Ambulatory Visit (INDEPENDENT_AMBULATORY_CARE_PROVIDER_SITE_OTHER): Payer: Medicare Other | Admitting: *Deleted

## 2019-08-24 DIAGNOSIS — I442 Atrioventricular block, complete: Secondary | ICD-10-CM

## 2019-08-24 LAB — CUP PACEART REMOTE DEVICE CHECK
Battery Remaining Longevity: 131 mo
Battery Remaining Percentage: 95.5 %
Battery Voltage: 3.02 V
Brady Statistic RV Percent Paced: 99 %
Date Time Interrogation Session: 20210106020017
Implantable Lead Implant Date: 20110310
Implantable Lead Implant Date: 20110310
Implantable Lead Location: 753859
Implantable Lead Location: 753860
Implantable Pulse Generator Implant Date: 20200701
Lead Channel Impedance Value: 380 Ohm
Lead Channel Pacing Threshold Amplitude: 0.75 V
Lead Channel Pacing Threshold Pulse Width: 0.5 ms
Lead Channel Sensing Intrinsic Amplitude: 5.1 mV
Lead Channel Setting Pacing Amplitude: 1 V
Lead Channel Setting Pacing Pulse Width: 0.5 ms
Lead Channel Setting Sensing Sensitivity: 4 mV
Pulse Gen Model: 2272
Pulse Gen Serial Number: 3315946

## 2019-11-10 ENCOUNTER — Other Ambulatory Visit: Payer: Self-pay | Admitting: Internal Medicine

## 2019-11-10 NOTE — Telephone Encounter (Signed)
Pt last saw Dr Ladona Ridgel 05/25/19, last labs 02/09/19 Creat 0.87, age 79, weight 88.5kg, based on specified criteria pt is on appropriate dosage of Eliquis 5mg  BID.  Will refill rx.

## 2019-11-19 ENCOUNTER — Ambulatory Visit (HOSPITAL_COMMUNITY)
Admission: EM | Admit: 2019-11-19 | Discharge: 2019-11-19 | Disposition: A | Discharge status: Home / Self Care | Payer: Medicare Other

## 2019-11-19 ENCOUNTER — Other Ambulatory Visit: Payer: Self-pay

## 2019-11-19 ENCOUNTER — Encounter (HOSPITAL_COMMUNITY): Payer: Self-pay

## 2019-11-19 DIAGNOSIS — R1032 Left lower quadrant pain: Secondary | ICD-10-CM

## 2019-11-19 DIAGNOSIS — G8929 Other chronic pain: Secondary | ICD-10-CM

## 2019-11-19 DIAGNOSIS — K2970 Gastritis, unspecified, without bleeding: Secondary | ICD-10-CM

## 2019-11-19 MED ORDER — OMEPRAZOLE 40 MG PO CPDR: 40.0000 mg | DELAYED_RELEASE_CAPSULE | Freq: Every day | ORAL | 3 refills | Status: DC

## 2019-11-19 NOTE — ED Triage Notes (Signed)
Pt is here with left flank pain that started Thursday, pt has NOT taken any meds to relieve discomfort.

## 2019-11-19 NOTE — ED Provider Notes (Signed)
[facility]    CSN: [id number] Arrival date & time: 11/19/19  1318      History   Chief Complaint Chief Complaint  Patient presents with  . Flank Pain    Left    HPI Joseph Austin is a 79 y.o. male.   Patient presents to the office today with left-sided abdominal pain.  Patient speaks Spanish, visit conducted with face-to-face [employer] Spanish interpreter.  Patient reports that he has been having left-sided belly pain for the last 2 days.  He reports that he has never had a kidney stone.  Reports that he used to take omeprazole, but that he ran out.  Reports that he has a GI relationship and has established chronic gastritis.  Reports that he has had an EGD and colonoscopy x2.  He is not may attempt to treat this at home.  Per chart review, patient has medical history significant for heart attack, presence of a pacemaker, gastritis, hypertension, heart block.  Denies headache, cough, shortness of breath, nausea, vomiting, diarrhea, chills, body aches, rash, fever, other symptoms.  ROS per HPI  The history is provided by the patient. A language interpreter was used.    Past Medical History:  Diagnosis Date  . Arthritis   . Blockage of coronary artery of heart (HCC)   . Complete heart block (HCC)   . Depression   . Gastritis    Hx of   . Glaucoma   . History of heart attack 1992  . HTN (hypertension)   . Pacemaker   . Syncope   . Unspecified essential hypertension     Patient Active Problem List   Diagnosis Date Noted  . 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 Spinal stenosis, 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  . Hyperlipidemia LDL goal <100 04/13/2018  . Type 2 diabetes mellitus without complication, without long-term current use of insulin (HCC) 04/13/2018  . PAF (paroxysmal atrial fibrillation) (HCC) 03/09/2018  . Prediabetes 05/18/2017  . Tobacco dependence 05/18/2017  . Idiopathic  chronic gout without tophus 03/16/2017  . Other specified glaucoma 03/16/2017  . 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  . Low back pain 09/25/2016  . Chronic right-sided low back pain without sciatica 09/25/2016  . Unspecified gastritis and gastroduodenitis without mention of hemorrhage 11/17/2011  . Gastritis and gastroduodenitis 11/17/2011  . Cardiac pacemaker in situ 02/12/2010  . Essential hypertension 10/31/2008  . AV block, complete (HCC) 10/31/2008    Past Surgical History:  Procedure Laterality Date  . PACEMAKER INSERTION    . PPM GENERATOR CHANGEOUT N/A 02/16/2019   Procedure: PPM GENERATOR CHANGEOUT;  Surgeon: Marinus Maw, MD;  Location: [facility] INVASIVE CV LAB;  Service: Cardiovascular;  Laterality: N/A;       Home Medications    Prior to Admission medications   Medication Sig Start Date End Date Taking? Authorizing Provider  carvedilol (COREG) 25 MG tablet Take 1 and 1/2 tablets by mouth twice daily 06/29/19  Yes [provider]  hydrochlorothiazide (HYDRODIURIL) 25 MG tablet Take 1 tablet by mouth once daily. 06/29/19  Yes [provider]  rosuvastatin (CRESTOR) 20 MG tablet Take by mouth. 10/05/19  Yes [provider]  acetaminophen (TYLENOL) 500 MG tablet Take 1,000 mg by mouth every 6 (six) hours as needed for moderate pain or headache.    [provider]  allopurinol (ZYLOPRIM) 300 MG tablet Take 300 mg by mouth daily.    [provider]  carvedilol (COREG) 25 MG tablet TAKE 1 AND 1/2 TABLETS BY MOUTH TWICE DAILY WITH MEALS Patient taking differently: Take 25 mg by mouth 2 (two) times daily with a meal.  11/17/18   Marinus Maw, MD  diphenhydrAMINE-APAP, sleep, (TYLENOL PM EXTRA STRENGTH) 50-1000 MG/30ML LIQD Take 15 mLs by mouth at bedtime as needed (sleep).    [provider]  dorzolamide-timolol (COSOPT) 22.3-6.8 MG/ML ophthalmic solution Place 1 drop into both eyes 2 (two) times daily.     [provider]  ELIQUIS 5 MG TABS tablet TAKE 1 TABLET BY MOUTH TWICE DAILY 11/10/19   Marinus Maw, MD  hydrochlorothiazide (HYDRODIURIL) 25 MG tablet Take 1 tablet (25 mg total) by mouth daily. Please call and schedule December follow up [phone] 11/17/18   Marinus Maw, MD  omeprazole (PRILOSEC) 40 MG capsule Take 1 capsule (40 mg total) by mouth daily. 11/19/19   Moshe Cipro, NP  rosuvastatin (CRESTOR) 20 MG tablet Take 20 mg by mouth daily.  12/01/18   [provider]    Family History Family History  Problem Relation Age of Onset  . Diabetes Mother   . Lung cancer Father   . Colon cancer Neg Hx     Social History Social History   Tobacco Use  . Smoking status: Current Every Day Smoker  . Smokeless tobacco: Never Used  Substance Use Topics  . Alcohol use: Yes    Comment: Little usage  . Drug use: No     Allergies   Patient has no known allergies.   Review of Systems Review of Systems   Physical Exam Triage Vital Signs ED Triage Vitals  Enc Vitals Group     BP 11/19/19 1419 123/81     Pulse Rate 11/19/19 1419 64     Resp 11/19/19 1419 18     Temp 11/19/19 1419 98.3 F (36.8 C)     Temp Source 11/19/19 1419 Oral     SpO2 11/19/19 1419 98 %     Weight 11/19/19 1413 198 lb 3.2 oz (89.9 kg)     Height --      Head Circumference --      Peak Flow --      Pain Score 11/19/19 1413 5     Pain Loc --      Pain Edu? --      Excl. in [place]? --    No data found.  Updated Vital Signs BP 123/81 (BP Location: Right Arm)   Pulse 64   Temp 98.3 F (36.8 C) (Oral)   Resp 18   Wt 198 lb 3.2 oz (89.9 kg)   SpO2 98%   BMI 30.14 kg/m   Visual Acuity Right Eye Distance:   Left Eye Distance:   Bilateral Distance:    Right Eye Near:   Left Eye Near:    Bilateral Near:     Physical Exam Vitals and nursing note reviewed.  Constitutional:      General: He is not in acute distress.    Appearance: Normal appearance. He is  well-developed. He is obese. He is not ill-appearing.  HENT:     Head: Normocephalic and atraumatic.     Nose: Nose normal.     Mouth/Throat:     Mouth: Mucous membranes are moist.     Pharynx: Oropharynx is clear.  Eyes:     Conjunctiva/sclera: Conjunctivae normal.  Cardiovascular:     Rate and Rhythm: Normal rate and regular  rhythm.     Heart sounds: Normal heart sounds. No murmur.  Pulmonary:     Effort: Pulmonary effort is normal. No respiratory distress.     Breath sounds: Normal breath sounds. No stridor. No wheezing, rhonchi or rales.  Chest:     Chest wall: No tenderness.  Abdominal:     General: Bowel sounds are normal. There is no distension.     Palpations: Abdomen is soft. There is no mass.     Tenderness: There is no abdominal tenderness. There is no right CVA tenderness, left CVA tenderness, guarding or rebound.     Hernia: No hernia is present.  Musculoskeletal:        General: Normal range of motion.     Cervical back: Normal range of motion and neck supple.  Skin:    General: Skin is warm and dry.     Capillary Refill: Capillary refill takes less than 2 seconds.  Neurological:     General: No focal deficit present.     Mental Status: He is alert and oriented to person, place, and time.      UC Treatments / Results  Labs (all labs ordered are listed, but only abnormal results are displayed) Labs Reviewed - No data to display  EKG   Radiology No results found.  Procedures Procedures (including critical care time)  Medications Ordered in UC Medications - No data to display  Initial Impression / Assessment and Plan / UC Course  I have reviewed the triage vital signs and the nursing notes.  Pertinent labs & imaging results that were available during my care of the patient were reviewed by me and considered in my medical decision making (see chart for details).     Chronic LLQ pain: No history of chronic gastritis.  Patient nontender on exam  today.  Reports that he is not having any pain at the moment.  Requesting a refill for omeprazole.  Omeprazole 40 mg sent to patient's pharmacy.  Instructed patient to take this medication once a day to help prevent reflux and treat gastritis.  Discussed with patient the importance of having primary care physician and instructed to get established with 1.  Instructed patient to report to the ER for trouble swallowing, trouble breathing, other concerning symptoms.  Patient verbalized understanding and agrees with treatment plan. Final Clinical Impressions(s) / UC Diagnoses   Final diagnoses:  Chronic LLQ pain  Gastritis without bleeding, unspecified chronicity, unspecified gastritis type     Discharge Instructions     I have refilled your omeprazole today and I also put refills on this medication.   I would recommend that  you follow up with family practice or GI.    ED Prescriptions    Medication Sig Dispense Auth. Provider   omeprazole (PRILOSEC) 40 MG capsule Take 1 capsule (40 mg total) by mouth daily. 30 capsule Faustino Congress, NP     PDMP not reviewed this encounter.   Faustino Congress, NP 11/22/19 [phone]

## 2019-11-19 NOTE — Discharge Instructions (Addendum)
I have refilled your omeprazole today and I also put refills on this medication.   I would recommend that  you follow up with family practice or GI.

## 2019-11-23 ENCOUNTER — Ambulatory Visit (INDEPENDENT_AMBULATORY_CARE_PROVIDER_SITE_OTHER): Payer: Medicare Other | Admitting: *Deleted

## 2019-11-23 DIAGNOSIS — I442 Atrioventricular block, complete: Secondary | ICD-10-CM | POA: Diagnosis not present

## 2019-11-23 LAB — CUP PACEART REMOTE DEVICE CHECK
Battery Remaining Longevity: 129 mo
Battery Remaining Percentage: 95.5 %
Battery Voltage: 3.02 V
Brady Statistic RV Percent Paced: 98 %
Date Time Interrogation Session: 20210407020015
Implantable Lead Implant Date: 20110310
Implantable Lead Implant Date: 20110310
Implantable Lead Location: 753859
Implantable Lead Location: 753860
Implantable Pulse Generator Implant Date: 20200701
Lead Channel Impedance Value: 380 Ohm
Lead Channel Pacing Threshold Amplitude: 0.875 V
Lead Channel Pacing Threshold Pulse Width: 0.5 ms
Lead Channel Sensing Intrinsic Amplitude: 5.6 mV
Lead Channel Setting Pacing Amplitude: 1.125
Lead Channel Setting Pacing Pulse Width: 0.5 ms
Lead Channel Setting Sensing Sensitivity: 4 mV
Pulse Gen Model: 2272
Pulse Gen Serial Number: 3315946

## 2019-11-23 NOTE — Progress Notes (Signed)
PPM Remote  

## 2020-01-27 DIAGNOSIS — Z981 Arthrodesis status: Secondary | ICD-10-CM | POA: Insufficient documentation

## 2020-01-27 DIAGNOSIS — Z7901 Long term (current) use of anticoagulants: Secondary | ICD-10-CM | POA: Insufficient documentation

## 2020-02-09 ENCOUNTER — Encounter (HOSPITAL_COMMUNITY): Payer: Self-pay

## 2020-02-09 ENCOUNTER — Other Ambulatory Visit: Payer: Self-pay

## 2020-02-09 ENCOUNTER — Ambulatory Visit (INDEPENDENT_AMBULATORY_CARE_PROVIDER_SITE_OTHER): Payer: Medicare Other

## 2020-02-09 ENCOUNTER — Ambulatory Visit (HOSPITAL_COMMUNITY)
Admission: EM | Admit: 2020-02-09 | Discharge: 2020-02-09 | Disposition: A | Discharge status: Home / Self Care | Payer: Medicare Other | Attending: Emergency Medicine | Admitting: Emergency Medicine

## 2020-02-09 DIAGNOSIS — I872 Venous insufficiency (chronic) (peripheral): Secondary | ICD-10-CM

## 2020-02-09 DIAGNOSIS — S91001A Unspecified open wound, right ankle, initial encounter: Secondary | ICD-10-CM

## 2020-02-09 DIAGNOSIS — S81801A Unspecified open wound, right lower leg, initial encounter: Secondary | ICD-10-CM | POA: Diagnosis not present

## 2020-02-09 DIAGNOSIS — L03115 Cellulitis of right lower limb: Secondary | ICD-10-CM | POA: Diagnosis not present

## 2020-02-09 DIAGNOSIS — M5416 Radiculopathy, lumbar region: Secondary | ICD-10-CM | POA: Insufficient documentation

## 2020-02-09 DIAGNOSIS — S81001A Unspecified open wound, right knee, initial encounter: Secondary | ICD-10-CM | POA: Diagnosis not present

## 2020-02-09 MED ORDER — CEPHALEXIN 500 MG PO CAPS: 500.0000 mg | ORAL_CAPSULE | Freq: Four times a day (QID) | ORAL | 0 refills | Status: DC

## 2020-02-09 NOTE — Discharge Instructions (Addendum)
You need to follow up with cardiology and vascular for risk of decrease circulation  Keep leg elevated If wound opens will need to wash dry area and keep covered at this time there is no drainage but redness. We will treat with antibiotics  Go to the ER if you begin to have chest pain shortness of breath or blue color to leg

## 2020-02-09 NOTE — ED Provider Notes (Signed)
[facility]    CSN: [id number] Arrival date & time: 02/09/20  1741      History   Chief Complaint Chief Complaint  Patient presents with  . Skin Ulcer    HPI Joseph Austin is a 79 y.o. male.   Pt spanish speaking staff at bedside and family to interp. Pt has been treating rt ankle open wound , area is not draining currently, - calf pain, dose have some +2 edema, has been treating it with peroxide and aloe. Denies any sob, no chest pain,      Past Medical History:  Diagnosis Date  . Arthritis   . Blockage of coronary artery of heart (HCC)   . Complete heart block (HCC)   . Depression   . Gastritis    Hx of   . Glaucoma   . History of heart attack 1992  . HTN (hypertension)   . Pacemaker   . Syncope   . Unspecified essential hypertension     Patient Active Problem List   Diagnosis Date Noted  . 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 Spinal stenosis, 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  . Hyperlipidemia LDL goal <100 04/13/2018  . Type 2 diabetes mellitus without complication, without long-term current use of insulin (HCC) 04/13/2018  . PAF (paroxysmal atrial fibrillation) (HCC) 03/09/2018  . Prediabetes 05/18/2017  . Tobacco dependence 05/18/2017  . 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  . Other specified glaucoma 03/16/2017  . 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  . Low back pain 09/25/2016  . Chronic right-sided low back pain without sciatica 09/25/2016  . Unspecified gastritis and gastroduodenitis without mention of hemorrhage 11/17/2011  . Gastritis and gastroduodenitis 11/17/2011  . Cardiac pacemaker in situ 02/12/2010  . Essential hypertension 10/31/2008  . AV block, complete (HCC) 10/31/2008    Past Surgical History:  Procedure Laterality Date  . PACEMAKER INSERTION    . PPM GENERATOR CHANGEOUT N/A 02/16/2019   Procedure: PPM GENERATOR  CHANGEOUT;  Surgeon: Marinus Maw, MD;  Location: [facility] INVASIVE CV LAB;  Service: Cardiovascular;  Laterality: N/A;       Home Medications    Prior to Admission medications   Medication Sig Start Date End Date Taking? Authorizing Provider  acetaminophen (TYLENOL) 500 MG tablet Take 1,000 mg by mouth every 6 (six) hours as needed for moderate pain or headache.    [provider]  allopurinol (ZYLOPRIM) 300 MG tablet Take 300 mg by mouth daily.    [provider]  carvedilol (COREG) 25 MG tablet TAKE 1 AND 1/2 TABLETS BY MOUTH TWICE DAILY WITH MEALS Patient taking differently: Take 25 mg by mouth 2 (two) times daily with a meal.  11/17/18   Marinus Maw, MD  carvedilol (COREG) 25 MG tablet Take 1 and 1/2 tablets by mouth twice daily 06/29/19   [provider]  cephALEXin (KEFLEX) 500 MG capsule Take 1 capsule (500 mg total) by mouth 4 (four) times daily. 02/09/20   Coralyn Mark, NP  diphenhydrAMINE-APAP, sleep, (TYLENOL PM EXTRA STRENGTH) 50-1000 MG/30ML LIQD Take 15 mLs by mouth at bedtime as needed (sleep).    [provider]  dorzolamide-timolol (COSOPT) 22.3-6.8 MG/ML ophthalmic solution Place 1 drop into both eyes 2 (two) times daily.    [provider]  ELIQUIS 5 MG TABS tablet TAKE 1 TABLET BY MOUTH TWICE DAILY 11/10/19   Marinus Maw, MD  hydrochlorothiazide (HYDRODIURIL) 25  MG tablet Take 1 tablet (25 mg total) by mouth daily. Please call and schedule December follow up [phone] 11/17/18   Evans Lance, MD  hydrochlorothiazide (HYDRODIURIL) 25 MG tablet Take 1 tablet by mouth once daily. 06/29/19   [provider]  omeprazole (PRILOSEC) 40 MG capsule Take 1 capsule (40 mg total) by mouth daily. 11/19/19   Faustino Congress, NP  rosuvastatin (CRESTOR) 20 MG tablet Take 20 mg by mouth daily.  12/01/18   [provider]  rosuvastatin (CRESTOR) 20 MG tablet Take by mouth. 10/05/19   [provider]     Family History Family History  Problem Relation Age of Onset  . Diabetes Mother   . Lung cancer Father   . Colon cancer Neg Hx     Social History Social History   Tobacco Use  . Smoking status: Current Every Day Smoker  . Smokeless tobacco: Never Used  Substance Use Topics  . Alcohol use: Yes    Comment: Little usage  . Drug use: No     Allergies   Patient has no known allergies.   Review of Systems Review of Systems  Respiratory: Negative.   Cardiovascular: Negative.   Gastrointestinal: Negative.   Musculoskeletal:       Rt leg swelling and small wound on lt inner ankle   Skin: Positive for color change, rash and wound.  Neurological: Negative.      Physical Exam Triage Vital Signs ED Triage Vitals  Enc Vitals Group     BP 02/09/20 1827 134/74     Pulse Rate 02/09/20 1827 61     Resp 02/09/20 1827 18     Temp 02/09/20 1827 98.4 F (36.9 C)     Temp Source 02/09/20 1827 Oral     SpO2 02/09/20 1827 98 %     Weight --      Height --      Head Circumference --      Peak Flow --      Pain Score 02/09/20 1825 4     Pain Loc --      Pain Edu? --      Excl. in [place]? --    No data found.  Updated Vital Signs BP 134/74 (BP Location: Left Arm)   Pulse 61   Temp 98.4 F (36.9 C) (Oral)   Resp 18   SpO2 98%   Visual Acuity      Physical Exam Cardiovascular:     Rate and Rhythm: Regular rhythm.     Pulses: Normal pulses.  Pulmonary:     Effort: Pulmonary effort is normal.  Abdominal:     General: Abdomen is flat.  Skin:    Capillary Refill: Capillary refill takes 2 to 3 seconds.     Findings: Erythema and lesion present.     Comments: Strong pedal pulse, +2 edema to rt ankle and foot, non draining linear 2cm open wound to anterior ankle tenderness to touch, warm to touch on surrounding area, - homan, on blood thinners, denies pain, brown areas surrounding wound with slight cellulites and erythema noted.   Neurological:     General: No focal  deficit present.     Mental Status: He is alert.      UC Treatments / Results  Labs (all labs ordered are listed, but only abnormal results are displayed) Labs Reviewed - No data to display  EKG   Radiology DG Ankle Complete Right  Result Date: 02/09/2020 CLINICAL DATA:  79 year old  male with concern for acute osteomyelitis. Right ankle wound. EXAM: RIGHT ANKLE - COMPLETE 3+ VIEW COMPARISON:  None. FINDINGS: There is no acute fracture or dislocation. The bones are well mineralized. No arthritic changes. No bone erosion or periosteal elevation to suggest acute osteomyelitis. The ankle mortise is intact. There is mild diffuse subcutaneous edema as well as scattered skin calcification likely sequela of chronic vascular stasis. No soft tissue air. IMPRESSION: No acute osseous pathology. No evidence of acute osteomyelitis by radiograph. Electronically Signed   By: Elgie Collard M.D.   On: 02/09/2020 19:55    Procedures Procedures (including critical care time)  Medications Ordered in UC Medications - No data to display  Initial Impression / Assessment and Plan / UC Course  I have reviewed the triage vital signs and the nursing notes.  Pertinent labs & imaging results that were available during my care of the patient were reviewed by me and considered in my medical decision making (see chart for details).    Spoke with hagler on pt and will obtain x ray place on abx. Pt will need to see vascular, cardiology and pcp  Will need to see wound clinic for care  Take full dose of abx  Reviewed previous chart  Any sob, or chest pain will need to go to the er   Final Clinical Impressions(s) / UC Diagnoses   Final diagnoses:  Cellulitis of right lower extremity  Open wound of right knee, leg, and ankle with complication, initial encounter  Peripheral vascular disease with stasis dermatitis     Discharge Instructions     You need to follow up with cardiology and vascular for risk  of decrease circulation  Keep leg elevated If wound opens will need to wash dry area and keep covered at this time there is no drainage but redness. We will treat with antibiotics  Go to the ER if you begin to have chest pain shortness of breath or blue color to leg     ED Prescriptions    Medication Sig Dispense Auth. Provider   cephALEXin (KEFLEX) 500 MG capsule Take 1 capsule (500 mg total) by mouth 4 (four) times daily. 20 capsule Coralyn Mark, NP     PDMP not reviewed this encounter.   Coralyn Mark, NP 02/09/20 2001

## 2020-02-09 NOTE — ED Triage Notes (Signed)
Pt presents to UC with an ulcer in the right ankle x 1 month.Triple antibiotic cream and aloe vera did not helped.

## 2020-02-10 ENCOUNTER — Telehealth: Payer: Self-pay | Admitting: Internal Medicine

## 2020-02-10 NOTE — Telephone Encounter (Signed)
Daughter of the patient called. The Daughter said the patient was in the hospital last night and was told to contact her Cardiologist to have some Vascular tests done. There were no orders in the patient's chart. Please confirm with Dr. Ladona Ridgel what tests will need done and let the patient know.   The Daughter does not speak English well, so an interpreter was used during the call. Please use an interpreter when contacting the Daughter.

## 2020-02-10 NOTE — Telephone Encounter (Signed)
Reviewed Pt chart/urgent care visit 6/24  Pt diagnosed with open wound to ankle with possible cellulitis.  Pt was advised to follow up with PCP/vascular/cardiology  Per review of chart- Pt needs to follow up with PCP.  PCP would be able to assess wound and determine if Pt's requires referral to vascular for further tx.    Notified Pt's daughter.  Per review of dc summary there was an appt with PCP for 6/26 (?) or possibly just advisement to family?  Advised family via interpreter this nurse would call PCP office to get them set up with appt.  Call placed to Dr. Einar Grad office.  Appt made for 02/14/20 at 9:30 am with Dr. Kristen Loader (sp?).  Returned call to Pt's daughter via interpreter to advise of upcoming appt.    Pt has scheduled cardiology f/u July 8.

## 2020-02-18 ENCOUNTER — Other Ambulatory Visit: Payer: Self-pay | Admitting: Internal Medicine

## 2020-02-21 NOTE — Telephone Encounter (Signed)
Prescription refill request for Eliquis received.  Last office visit: Joseph Austin 05/25/2019 Scr: 0.82, 10/05/2019 Age:  79 y.o. Weight: 89.9 kg   Prescription refill sent.

## 2020-02-22 ENCOUNTER — Ambulatory Visit (INDEPENDENT_AMBULATORY_CARE_PROVIDER_SITE_OTHER): Payer: Medicare Other | Admitting: *Deleted

## 2020-02-22 DIAGNOSIS — I442 Atrioventricular block, complete: Secondary | ICD-10-CM | POA: Diagnosis not present

## 2020-02-22 LAB — CUP PACEART REMOTE DEVICE CHECK
Battery Remaining Longevity: 127 mo
Battery Remaining Percentage: 95.5 %
Battery Voltage: 3.02 V
Brady Statistic RV Percent Paced: 98 %
Date Time Interrogation Session: 20210707020013
Implantable Lead Implant Date: 20110310
Implantable Lead Implant Date: 20110310
Implantable Lead Location: 753859
Implantable Lead Location: 753860
Implantable Pulse Generator Implant Date: 20200701
Lead Channel Impedance Value: 360 Ohm
Lead Channel Pacing Threshold Amplitude: 0.75 V
Lead Channel Pacing Threshold Pulse Width: 0.5 ms
Lead Channel Sensing Intrinsic Amplitude: 8.5 mV
Lead Channel Setting Pacing Amplitude: 1 V
Lead Channel Setting Pacing Pulse Width: 0.5 ms
Lead Channel Setting Sensing Sensitivity: 4 mV
Pulse Gen Model: 2272
Pulse Gen Serial Number: 3315946

## 2020-02-23 ENCOUNTER — Ambulatory Visit (INDEPENDENT_AMBULATORY_CARE_PROVIDER_SITE_OTHER): Payer: Medicare Other | Admitting: Internal Medicine

## 2020-02-23 ENCOUNTER — Encounter: Payer: Self-pay | Admitting: Internal Medicine

## 2020-02-23 ENCOUNTER — Other Ambulatory Visit: Payer: Self-pay

## 2020-02-23 ENCOUNTER — Other Ambulatory Visit: Payer: Self-pay | Admitting: Internal Medicine

## 2020-02-23 VITALS — BP 130/70 | HR 61 | Ht 68.0 in | Wt 198.0 lb

## 2020-02-23 DIAGNOSIS — I442 Atrioventricular block, complete: Secondary | ICD-10-CM | POA: Diagnosis not present

## 2020-02-23 DIAGNOSIS — Z950 Presence of cardiac pacemaker: Secondary | ICD-10-CM

## 2020-02-23 DIAGNOSIS — I480 Paroxysmal atrial fibrillation: Secondary | ICD-10-CM

## 2020-02-23 NOTE — Progress Notes (Signed)
HPI [name] returns today for followup. He has a h/o peristent atrial fib and CHB, s/p PPM. He has done well. In the interim he has reverted back to NSR. He denies chest pain or sob. No edema.  No Known Allergies   Current Outpatient Medications  Medication Sig Dispense Refill  . acetaminophen (TYLENOL) 500 MG tablet Take 1,000 mg by mouth every 6 (six) hours as needed for moderate pain or headache.    . allopurinol (ZYLOPRIM) 300 MG tablet Take 300 mg by mouth daily.    . carvedilol (COREG) 25 MG tablet Take 1 and 1/2 tablets by mouth twice daily    . cephALEXin (KEFLEX) 500 MG capsule Take 1 capsule (500 mg total) by mouth 4 (four) times daily. 20 capsule 0  . diphenhydrAMINE-APAP, sleep, (TYLENOL PM EXTRA STRENGTH) 50-1000 MG/30ML LIQD Take 15 mLs by mouth at bedtime as needed (sleep).    . dorzolamide-timolol (COSOPT) 22.3-6.8 MG/ML ophthalmic solution Place 1 drop into both eyes 2 (two) times daily.    [name] Kitchen ELIQUIS 5 MG TABS tablet TAKE 1 TABLET BY MOUTH TWICE DAILY 180 tablet 1  . hydrochlorothiazide (HYDRODIURIL) 25 MG tablet Take 1 tablet by mouth once daily.    [name] Kitchen omeprazole (PRILOSEC) 40 MG capsule Take 1 capsule (40 mg total) by mouth daily. 30 capsule 3  . rosuvastatin (CRESTOR) 20 MG tablet Take by mouth.    . gabapentin (NEURONTIN) 300 MG capsule Take 300 mg by mouth 2 (two) times daily.     No current facility-administered medications for this visit.     Past Medical History:  Diagnosis Date  . Arthritis   . Blockage of coronary artery of heart (HCC)   . Complete heart block (HCC)   . Depression   . Gastritis    Hx of   . Glaucoma   . History of heart attack 1992  . HTN (hypertension)   . Pacemaker   . Syncope   . Unspecified essential hypertension     ROS:   All systems reviewed and negative except as noted in the HPI.   Past Surgical History:  Procedure Laterality Date  . PACEMAKER INSERTION    . PPM GENERATOR CHANGEOUT N/A 02/16/2019    Procedure: PPM GENERATOR CHANGEOUT;  Surgeon: Marinus Maw, MD;  Location: [facility] INVASIVE CV LAB;  Service: Cardiovascular;  Laterality: N/A;     Family History  Problem Relation Age of Onset  . Diabetes Mother   . Lung cancer Father   . Colon cancer Neg Hx      Social History   Socioeconomic History  . Marital status: Widowed    Spouse name: Not on file  . Number of children: 1  . Years of education: Not on file  . Highest education level: Not on file  Occupational History  . Not on file  Tobacco Use  . Smoking status: Current Every Day Smoker  . Smokeless tobacco: Never Used  Substance and Sexual Activity  . Alcohol use: Yes    Comment: Little usage  . Drug use: No  . Sexual activity: Yes    Birth control/protection: Condom  Other Topics Concern  . Not on file  Social History Narrative  . Not on file   Social Determinants of Health   Financial Resource Strain:   . Difficulty of Paying Living Expenses:   Food Insecurity:   . Worried About Programme researcher, broadcasting/film/video in the Last Year:   . [employer]  of Food in the Last Year:   Transportation Needs:   . Freight forwarder (Medical):   [name] Kitchen Lack of Transportation (Non-Medical):   Physical Activity:   . Days of Exercise per Week:   . Minutes of Exercise per Session:   Stress:   . Feeling of Stress :   Social Connections:   . Frequency of Communication with Friends and Family:   . Frequency of Social Gatherings with Friends and Family:   . Attends Religious Services:   . Active Member of Clubs or Organizations:   . Attends Banker Meetings:   [name] Kitchen Marital Status:   Intimate Partner Violence:   . Fear of Current or Ex-Partner:   . Emotionally Abused:   [name] Kitchen Physically Abused:   . Sexually Abused:      BP 130/70   Pulse 61   Ht 5\' 8"  (1.727 m)   Wt 198 lb (89.8 kg)   SpO2 98%   BMI 30.11 kg/m   Physical Exam:  Well appearing NAD HEENT: Unremarkable Neck:  No JVD, no thyromegally Lymphatics:  No  adenopathy Back:  No CVA tenderness Lungs:  Clear with no wheezes HEART:  Regular rate rhythm, no murmurs, no rubs, no clicks Abd:  soft, positive bowel sounds, no organomegally, no rebound, no guarding Ext:  2 plus pulses, no edema, no cyanosis, no clubbing Skin:  No rashes no nodules Neuro:  CN II through XII intact, motor grossly intact  EKG - nsr with ventricular pacing  DEVICE  Normal device function.  See PaceArt for details. Reprogrammed DDD  Assess/Plan: 1. CHB - he is asymptomatic, s/p PPM insertion. 2. PPM - his [employer] DDD PM is reprogrammed from VVI to DDD 3. HTN - his bp is controlled. No change. 4. Persistent atrial fib - he appears to be back in NSR and hopefully he will continue his NSR. 5. Preoperative eval - he is pending spinal injection. He may hold his blood thinner and undergo spinal injection. On Eliquis, the half life is 6-8 hours. He can stop 2 or 3 days prior to the injection and restart when ok with surgeon.  .D.

## 2020-02-23 NOTE — Patient Instructions (Addendum)
Medication Instructions:  Your physician recommends that you continue on your current medications as directed. Please refer to the Current Medication list given to you today.  *If you need a refill on your cardiac medications before your next appointment, please call your pharmacy*  Lab Work: None ordered.  If you have labs (blood work) drawn today and your tests are completely normal, you will receive your results only by: [name] Kitchen MyChart Message (if you have MyChart) OR . A paper copy in the mail If you have any lab test that is abnormal or we need to change your treatment, we will call you to review the results.  Testing/Procedures: None ordered.  Follow-Up: At [facility], you and your health needs are our priority.  As part of our continuing mission to provide you with exceptional heart care, we have created designated Provider Care Teams.  These Care Teams include your primary Cardiologist (physician) and Advanced Practice Providers (APPs -  Physician Assistants and Nurse Practitioners) who all work together to provide you with the care you need, when you need it.  We recommend signing up for the patient portal called "MyChart".  Sign up information is provided on this After Visit Summary.  MyChart is used to connect with patients for Virtual Visits (Telemedicine).  Patients are able to view lab/test results, encounter notes, upcoming appointments, etc.  Non-urgent messages can be sent to your provider as well.   To learn more about what you can do with MyChart, go to [email or web address].    Your next appointment:   Your physician wants you to follow-up in: 1 year with Dr. Court Joy will receive a reminder letter in the mail two months in advance. If you don't receive a letter, please call our office to schedule the follow-up appointment.  Remote monitoring is used to monitor your Pacemaker from home. This monitoring reduces the number of office visits required to check your device  to one time per year. It allows [place] to keep an eye on the functioning of your device to ensure it is working properly. You are scheduled for a device check from home on 05/23/2020. You may send your transmission at any time that day. If you have a wireless device, the transmission will be sent automatically. After your physician reviews your transmission, you will receive a postcard with your next transmission date.  Other Instructions:

## 2020-02-24 LAB — CUP PACEART INCLINIC DEVICE CHECK
Battery Remaining Longevity: 93 mo
Battery Voltage: 3.01 V
Brady Statistic RA Percent Paced: 0 %
Brady Statistic RV Percent Paced: 98 %
Date Time Interrogation Session: 20210708175015
Implantable Lead Implant Date: 20110310
Implantable Lead Implant Date: 20110310
Implantable Lead Location: 753859
Implantable Lead Location: 753860
Implantable Pulse Generator Implant Date: 20200701
Lead Channel Impedance Value: 350 Ohm
Lead Channel Impedance Value: 350 Ohm
Lead Channel Pacing Threshold Amplitude: 0.5 V
Lead Channel Pacing Threshold Amplitude: 0.875 V
Lead Channel Pacing Threshold Pulse Width: 0.5 ms
Lead Channel Pacing Threshold Pulse Width: 0.5 ms
Lead Channel Sensing Intrinsic Amplitude: 2.5 mV
Lead Channel Setting Pacing Amplitude: 1.125
Lead Channel Setting Pacing Amplitude: 2 V
Lead Channel Setting Pacing Pulse Width: 0.5 ms
Lead Channel Setting Sensing Sensitivity: 4 mV
Pulse Gen Model: 2272
Pulse Gen Serial Number: 3315946

## 2020-02-24 NOTE — Progress Notes (Signed)
Remote pacemaker transmission.   

## 2020-02-27 ENCOUNTER — Telehealth: Payer: Self-pay | Admitting: *Deleted

## 2020-02-27 NOTE — Telephone Encounter (Signed)
   Primary Cardiologist: Lewayne Bunting, MD  Chart reviewed as part of pre-operative protocol coverage.   Patient was seen by Dr. Ladona Ridgel 02/23/20 and recommended to hold eliquis 2-3 days prior to his upcoming spinal injection with plans to restart when cleared to do so by his surgeon. No need for lovenox bridge at this time.  I will route this recommendation to the requesting party via Epic fax function and remove from pre-op pool.  Please call with questions.  Beatriz Stallion, PA-C 02/27/2020, 4:35 PM

## 2020-02-27 NOTE — Telephone Encounter (Signed)
° °  [facility] Pre-operative Risk Assessment    HEARTCARE STAFF: - Please ensure there is not already an duplicate clearance open for this procedure. - Under Visit Info/Reason for Call, type in Other and utilize the format Clearance MM/DD/YY or Clearance TBD. Do not use dashes or single digits. - If request is for dental extraction, please clarify the # of teeth to be extracted.  Request for surgical clearance:  1. What type of surgery is being performed? EPIDURAL STEROID INJECTION   2. When is this surgery scheduled? 03/02/20   3. What type of clearance is required (medical clearance vs. Pharmacy clearance to hold med vs. Both)? PHARM  4. Are there any medications that need to be held prior to surgery and how long? ELIQUIS x 3 DAYS PRIOR TO INJECTION; IF PT REQUIRES LOVENOX BRIDGE, WE ARE ALSO ASKING REQUESTING OFFICE THAT THIS BE ARRANGED BY THE PRESCRIBING PROVIDER  5. Practice name and name of physician performing surgery? [facility]   6. What is the office phone number? [phone]   7.   What is the office fax number? [phone]  8.   Anesthesia type (None, local, MAC, general) ? NOT LISTED   Julaine Hua 02/27/2020, 4:17 PM  _________________________________________________________________   (provider comments below)

## 2020-03-11 ENCOUNTER — Emergency Department (HOSPITAL_COMMUNITY): Payer: Medicare Other

## 2020-03-11 ENCOUNTER — Encounter (HOSPITAL_COMMUNITY): Payer: Self-pay

## 2020-03-11 ENCOUNTER — Emergency Department (HOSPITAL_COMMUNITY)
Admission: EM | Admit: 2020-03-11 | Discharge: 2020-03-11 | Disposition: A | Discharge status: Home / Self Care | Payer: Medicare Other | Attending: Emergency Medicine | Admitting: Emergency Medicine

## 2020-03-11 DIAGNOSIS — I10 Essential (primary) hypertension: Secondary | ICD-10-CM | POA: Diagnosis not present

## 2020-03-11 DIAGNOSIS — F17200 Nicotine dependence, unspecified, uncomplicated: Secondary | ICD-10-CM | POA: Insufficient documentation

## 2020-03-11 DIAGNOSIS — Z7901 Long term (current) use of anticoagulants: Secondary | ICD-10-CM | POA: Diagnosis not present

## 2020-03-11 DIAGNOSIS — M545 Low back pain: Secondary | ICD-10-CM | POA: Diagnosis present

## 2020-03-11 DIAGNOSIS — M4807 Spinal stenosis, lumbosacral region: Secondary | ICD-10-CM | POA: Diagnosis not present

## 2020-03-11 DIAGNOSIS — Z79899 Other long term (current) drug therapy: Secondary | ICD-10-CM | POA: Diagnosis not present

## 2020-03-11 DIAGNOSIS — E119 Type 2 diabetes mellitus without complications: Secondary | ICD-10-CM | POA: Insufficient documentation

## 2020-03-11 LAB — CBC
HCT: 39.1 % (ref 39.0–52.0)
Hemoglobin: 13.1 g/dL (ref 13.0–17.0)
MCH: 32.8 pg (ref 26.0–34.0)
MCHC: 33.5 g/dL (ref 30.0–36.0)
MCV: 98 fL (ref 80.0–100.0)
Platelets: 161 10*3/uL (ref 150–400)
RBC: 3.99 MIL/uL — ABNORMAL LOW (ref 4.22–5.81)
RDW: 13.2 % (ref 11.5–15.5)
WBC: 9.6 10*3/uL (ref 4.0–10.5)
nRBC: 0 % (ref 0.0–0.2)

## 2020-03-11 LAB — BASIC METABOLIC PANEL
Anion gap: 12 (ref 5–15)
BUN: 22 mg/dL (ref 8–23)
CO2: 23 mmol/L (ref 22–32)
Calcium: 9.3 mg/dL (ref 8.9–10.3)
Chloride: 100 mmol/L (ref 98–111)
Creatinine, Ser: 0.73 mg/dL (ref 0.61–1.24)
GFR calc Af Amer: >60 mL/min (ref >60)
GFR calc non Af Amer: >60 mL/min (ref >60)
Glucose, Bld: 280 mg/dL — ABNORMAL HIGH (ref 70–99)
Potassium: 3.8 mmol/L (ref 3.5–5.1)
Sodium: 135 mmol/L (ref 135–145)

## 2020-03-11 MED ORDER — HYDROMORPHONE HCL 1 MG/ML IJ SOLN
0.5000 mg | INTRAMUSCULAR | Status: AC | PRN
  Administered 2020-03-11 (×2): 0.5 mg via INTRAVENOUS
  Filled 2020-03-11 (×2): qty 1

## 2020-03-11 MED ORDER — HYDROCODONE-ACETAMINOPHEN 5-325 MG PO TABS: 1.0000 | ORAL_TABLET | Freq: Four times a day (QID) | ORAL | 0 refills | Status: DC | PRN

## 2020-03-11 MED ORDER — PREDNISONE 50 MG PO TABS: 50.0000 mg | ORAL_TABLET | Freq: Every day | ORAL | 0 refills | Status: DC

## 2020-03-11 NOTE — ED Notes (Signed)
Pt able to ambulate with assistance x 2. Pt was able to take approx 5 steps before having to turn around and return to stretcher.

## 2020-03-11 NOTE — ED Notes (Signed)
Patient verbalizes understanding of discharge instructions. Opportunity for questioning and answers were provided. Armband removed by staff, pt discharged from ED ambulatory.   

## 2020-03-11 NOTE — ED Triage Notes (Signed)
Pt arrives to ED via [facility] from home w/ c/o 10/10 chronic back pain that has worsened today. Pt denies injury fall. Pt denies urinary symptoms. EMS VSS.

## 2020-03-11 NOTE — ED Provider Notes (Signed)
[facility] Provider Note   CSN: [id number] Arrival date & time: 03/11/20  1259     History Chief Complaint  Patient presents with  . Back Pain    Joseph Austin is a 79 y.o. male.  HPI   Pt states he has been having 4 days of pain in his waist and spine.  He can barely because of the pain.  He can barely walk because of the pain.  Pt had to come in by ambulance.  The pain is more on the right.  It sometimes radiates to the leg.  Pt has seen a spine doctor in [place].   He is scheduled for an epidural.  He has had pain in his back for a while but never like this.  He had been able to walk until the last several days.  He went to see his family doctor and gave him a pain medication.  No fevers or chills.  No vomiting.  Past Medical History:  Diagnosis Date  . Arthritis   . Blockage of coronary artery of heart (HCC)   . Complete heart block (HCC)   . Depression   . Gastritis    Hx of   . Glaucoma   . History of heart attack 1992  . HTN (hypertension)   . Pacemaker   . Syncope   . Unspecified essential hypertension     Patient Active Problem List   Diagnosis Date Noted  . 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 Spinal stenosis, 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  . Hyperlipidemia LDL goal <100 04/13/2018  . Type 2 diabetes mellitus without complication, without long-term current use of insulin (HCC) 04/13/2018  . PAF (paroxysmal atrial fibrillation) (HCC) 03/09/2018  . Prediabetes 05/18/2017  . Tobacco dependence 05/18/2017  . 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  . Other specified glaucoma 03/16/2017  . 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  . Low back pain 09/25/2016  . Chronic right-sided low back pain without sciatica 09/25/2016  . Unspecified gastritis and gastroduodenitis without mention of hemorrhage 11/17/2011  . Gastritis  and gastroduodenitis 11/17/2011  . Cardiac pacemaker in situ 02/12/2010  . Essential hypertension 10/31/2008  . AV block, complete (HCC) 10/31/2008    Past Surgical History:  Procedure Laterality Date  . PACEMAKER INSERTION    . PPM GENERATOR CHANGEOUT N/A 02/16/2019   Procedure: PPM GENERATOR CHANGEOUT;  Surgeon: Marinus Mawaylor, Gregg W, MD;  [facility];  Service: Cardiovascular;  Laterality: N/A;       Family History  Problem Relation Age of Onset  . Diabetes Mother   . Lung cancer Father   . Colon cancer Neg Hx     Social History   Tobacco Use  . Smoking status: Current Every Day Smoker  . Smokeless tobacco: Never Used  Substance Use Topics  . Alcohol use: Yes    Comment: Little usage  . Drug use: No    Home Medications Prior to Admission medications   Medication Sig Start Date End Date Taking? Authorizing Provider  hydrochlorothiazide (HYDRODIURIL) 25 MG tablet Take 1 tablet (25 mg total) by mouth daily. 02/24/20   Marinus Mawaylor, Gregg W, MD  acetaminophen (TYLENOL) 500 MG tablet Take 1,000 mg by mouth every 6 (six) hours as needed for moderate pain or headache.    [provider]  allopurinol (ZYLOPRIM) 300 MG tablet Take 300 mg by mouth daily.  [provider]  carvedilol (COREG) 25 MG tablet Take 1 and 1/2 tablets by mouth twice daily 06/29/19   [provider]  cephALEXin (KEFLEX) 500 MG capsule Take 1 capsule (500 mg total) by mouth 4 (four) times daily. 02/09/20   Coralyn Mark, NP  diphenhydrAMINE-APAP, sleep, (TYLENOL PM EXTRA STRENGTH) 50-1000 MG/30ML LIQD Take 15 mLs by mouth at bedtime as needed (sleep).    [provider]  dorzolamide-timolol (COSOPT) 22.3-6.8 MG/ML ophthalmic solution Place 1 drop into both eyes 2 (two) times daily.    [provider]  ELIQUIS 5 MG TABS tablet TAKE 1 TABLET BY MOUTH TWICE DAILY 02/21/20   Marinus Maw, MD  gabapentin (NEURONTIN) 300 MG capsule Take 300 mg by mouth 2 (two)  times daily. 02/21/20   [provider]  HYDROcodone-acetaminophen (NORCO/VICODIN) 5-325 MG tablet Take 1 tablet by mouth every 6 (six) hours as needed. 03/11/20   Linwood Dibbles, MD  omeprazole (PRILOSEC) 40 MG capsule Take 1 capsule (40 mg total) by mouth daily. 11/19/19   Moshe Cipro, NP  predniSONE (DELTASONE) 50 MG tablet Take 1 tablet (50 mg total) by mouth daily. 03/11/20   Linwood Dibbles, MD  rosuvastatin (CRESTOR) 20 MG tablet Take by mouth. 10/05/19   [provider]    Allergies    Patient has no known allergies.  Review of Systems   Review of Systems  All other systems reviewed and are negative.   Physical Exam Updated Vital Signs BP (!) 147/69   Pulse 73   Resp 16   SpO2 96%   Physical Exam Vitals and nursing note reviewed.  Constitutional:      General: He is not in acute distress.    Appearance: He is well-developed.  HENT:     Head: Normocephalic and atraumatic.     Right Ear: External ear normal.     Left Ear: External ear normal.  Eyes:     General: No scleral icterus.       Right eye: No discharge.        Left eye: No discharge.     Conjunctiva/sclera: Conjunctivae normal.  Neck:     Trachea: No tracheal deviation.  Cardiovascular:     Rate and Rhythm: Normal rate and regular rhythm.  Pulmonary:     Effort: Pulmonary effort is normal. No respiratory distress.     Breath sounds: Normal breath sounds. No stridor. No wheezing or rales.  Abdominal:     General: Bowel sounds are normal. There is no distension.     Palpations: Abdomen is soft.     Tenderness: There is no abdominal tenderness. There is no guarding or rebound.  Musculoskeletal:     Cervical back: Normal and neck supple.     Thoracic back: Normal.     Lumbar back: Tenderness and bony tenderness present.  Skin:    General: Skin is warm and dry.     Findings: No rash.  Neurological:     Mental Status: He is alert.     Cranial Nerves: No cranial nerve deficit (no facial droop,  extraocular movements intact, no slurred speech).     Sensory: No sensory deficit.     Motor: No abnormal muscle tone or seizure activity.     Coordination: Coordination normal.     ED Results / Procedures / Treatments   Labs (all labs ordered are listed, but only abnormal results are displayed) Labs Reviewed  CBC - Abnormal; Notable for the following components:  Result Value   RBC 3.99 (*)    All other components within normal limits  BASIC METABOLIC PANEL - Abnormal; Notable for the following components:   Glucose, Bld 280 (*)    All other components within normal limits    EKG None  Radiology CT Lumbar Spine Wo Contrast  Result Date: 03/11/2020 CLINICAL DATA:  Chronic back pain worsening recently. EXAM: CT LUMBAR SPINE WITHOUT CONTRAST TECHNIQUE: Multidetector CT imaging of the lumbar spine was performed without intravenous contrast administration. Multiplanar CT image reconstructions were also generated. COMPARISON:  09/10/2018 FINDINGS: Segmentation: Numbering is preserved from the prior study. There are 6 lumbar type vertebral bodies. There is partial sacralization of L6 with of left-sided assimilation joint Alignment: Grade 1 anterolisthesis at L5-6 Vertebrae: No acute fracture or focal pathologic process. Paraspinal and other soft tissues: Calcific aortic atherosclerosis. Disc levels: T12-L1: No disc herniation or spinal canal stenosis. L1-2: Disc bulge with endplate spurring. Moderate facet hypertrophy. No central spinal canal stenosis. Moderate left foraminal stenosis. L2-3: Moderate facet hypertrophy and small disc bulge. No central spinal canal stenosis. Mild right and moderate left foraminal stenosis. L3-4: Moderate facet hypertrophy and intermediate sized disc bulge. No central spinal canal stenosis. Moderate right and mild left foraminal stenosis. L4-5: Intermediate disc bulge and moderate facet hypertrophy. Mild spinal canal stenosis. Moderate right and mild left neural  foraminal stenosis. L5-6: Severe facet hypertrophy with intermediate sized disc bulge and endplate spurring. Severe spinal canal stenosis with moderate right foraminal stenosis. L6-S1: Moderate facet hypertrophy. No spinal canal or neural foraminal stenosis. IMPRESSION: 1. 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 2. Unchanged grade 1 anterolisthesis at L5-6 with severe spinal canal stenosis and moderate right foraminal stenosis. 3. Multilevel foraminal stenosis, unchanged. 4. Aortic Atherosclerosis (ICD10-I70.0). Electronically Signed   By: Deatra Robinson M.D.   On: 03/11/2020 20:07    Procedures Procedures (including critical care time)  Medications Ordered in ED Medications  HYDROmorphone (DILAUDID) injection 0.5 mg (0.5 mg Intravenous Given 03/11/20 2034)    ED Course  I have reviewed the triage vital signs and the nursing notes.  Pertinent labs & imaging results that were available during my care of the patient were reviewed by me and considered in my medical decision making (see chart for details).  Clinical Course as of Mar 11 2202  Wynelle Link Mar 11, 2020  2014 Labs reviewed.  CBC normal.  Metabolic panel does show elevated glucose   [JK]  2014 CT scan shows severe spinal stenosis at L5-L6.  Moderate foraminal stenosis   [JK]  2048 Pt attempted to ambulate.  Unable to do so.  Will give an additional dose of pain med    [JK]    Clinical Course User Index [JK] Linwood Dibbles, MD   MDM Rules/Calculators/A&P                          Patient presents to the ED with complaints of back pain.  Patient has history of spinal stenosis.  CT scan was performed and this shows his spinal stenosis but no other acute findings.  Laboratory tests are unremarkable.  Patient was given pain medications.  His symptoms improved and he has been able to sit up and walk around.  He still is having pain but it is manageable and he feels comfortable going home.  Will discharge home with  prescription of steroids and Norco. Final Clinical Impression(s) / ED Diagnoses Final diagnoses:  Spinal  stenosis of lumbosacral region    Rx / DC Orders ED Discharge Orders         Ordered    predniSONE (DELTASONE) 50 MG tablet  Daily     Discontinue  Reprint     03/11/20 2200    HYDROcodone-acetaminophen (NORCO/VICODIN) 5-325 MG tablet  Every 6 hours PRN     Discontinue  Reprint     03/11/20 2200           Linwood Dibbles, MD 03/11/20 2204

## 2020-03-11 NOTE — Discharge Instructions (Addendum)
Take the medications as prescribed.  Follow-up with your spine doctor.  Give them a call next week

## 2020-04-01 ENCOUNTER — Other Ambulatory Visit: Payer: Self-pay

## 2020-04-01 ENCOUNTER — Ambulatory Visit (HOSPITAL_COMMUNITY)
Admission: EM | Admit: 2020-04-01 | Discharge: 2020-04-01 | Disposition: A | Discharge status: Home / Self Care | Payer: Medicare Other | Attending: Urgent Care | Admitting: Urgent Care

## 2020-04-01 ENCOUNTER — Encounter (HOSPITAL_COMMUNITY): Payer: Self-pay | Admitting: Emergency Medicine

## 2020-04-01 DIAGNOSIS — N4889 Other specified disorders of penis: Secondary | ICD-10-CM

## 2020-04-01 DIAGNOSIS — N481 Balanitis: Secondary | ICD-10-CM | POA: Diagnosis not present

## 2020-04-01 MED ORDER — CLOTRIMAZOLE-BETAMETHASONE 1-0.05 % EX CREA: TOPICAL_CREAM | CUTANEOUS | 0 refills | Status: DC

## 2020-04-01 NOTE — ED Triage Notes (Signed)
Pt c/o rash in groin area for approx 8 days. Denies pain.

## 2020-04-01 NOTE — ED Provider Notes (Signed)
[facility]   MRN: [id number] DOB: January 28, 1941  Subjective:   Joseph Austin is a 79 y.o. male presenting for a day history of persistent penile pain, pain over the underside of his foreskin.  Patient denies fever, nausea, vomiting, penile discharge.  States that he had a lot of back pain that was resolved through his regular doctor but during that time he was unable to clean his penis for about 4 days.  No current facility-administered medications for this encounter.  Current Outpatient Medications:    hydrochlorothiazide (HYDRODIURIL) 25 MG tablet, Take 1 tablet (25 mg total) by mouth daily., Disp: 90 tablet, Rfl: 3   acetaminophen (TYLENOL) 500 MG tablet, Take 1,000 mg by mouth every 6 (six) hours as needed for moderate pain or headache., Disp: , Rfl:    allopurinol (ZYLOPRIM) 300 MG tablet, Take 300 mg by mouth daily., Disp: , Rfl:    carvedilol (COREG) 25 MG tablet, Take 1 and 1/2 tablets by mouth twice daily, Disp: , Rfl:    cephALEXin (KEFLEX) 500 MG capsule, Take 1 capsule (500 mg total) by mouth 4 (four) times daily., Disp: 20 capsule, Rfl: 0   diphenhydrAMINE-APAP, sleep, (TYLENOL PM EXTRA STRENGTH) 50-1000 MG/30ML LIQD, Take 15 mLs by mouth at bedtime as needed (sleep)., Disp: , Rfl:    dorzolamide-timolol (COSOPT) 22.3-6.8 MG/ML ophthalmic solution, Place 1 drop into both eyes 2 (two) times daily., Disp: , Rfl:    ELIQUIS 5 MG TABS tablet, TAKE 1 TABLET BY MOUTH TWICE DAILY, Disp: 180 tablet, Rfl: 1   gabapentin (NEURONTIN) 300 MG capsule, Take 300 mg by mouth 2 (two) times daily., Disp: , Rfl:    HYDROcodone-acetaminophen (NORCO/VICODIN) 5-325 MG tablet, Take 1 tablet by mouth every 6 (six) hours as needed., Disp: 16 tablet, Rfl: 0   omeprazole (PRILOSEC) 40 MG capsule, Take 1 capsule (40 mg total) by mouth daily., Disp: 30 capsule, Rfl: 3   predniSONE (DELTASONE) 50 MG tablet, Take 1 tablet (50 mg total) by mouth daily., Disp: 5 tablet, Rfl: 0    rosuvastatin (CRESTOR) 20 MG tablet, Take by mouth., Disp: , Rfl:    No Known Allergies  Past Medical History:  Diagnosis Date   Arthritis    Blockage of coronary artery of heart (HCC)    Complete heart block (HCC)    Depression    Gastritis    Hx of    Glaucoma    History of heart attack 1992   HTN (hypertension)    Pacemaker    Syncope    Unspecified essential hypertension      Past Surgical History:  Procedure Laterality Date   PACEMAKER INSERTION     PPM GENERATOR CHANGEOUT N/A 02/16/2019   Procedure: PPM GENERATOR CHANGEOUT;  Surgeon: Marinus Maw, MD;  [facility]: [facility];  Service: Cardiovascular;  Laterality: N/A;    Family History  Problem Relation Age of Onset   Diabetes Mother    Lung cancer Father    Colon cancer Neg Hx     Social History   Tobacco Use   Smoking status: Current Every Day Smoker   Smokeless tobacco: Never Used  Substance Use Topics   Alcohol use: Yes    Comment: Little usage   Drug use: No    ROS   Objective:   Vitals: BP 124/81 (BP Location: Right Arm)    Pulse 61    Temp 98.4 F (36.9 C) (Oral)    Resp 16    SpO2 98%  Physical Exam Constitutional:      General: He is not in acute distress.    Appearance: Normal appearance. He is well-developed and normal weight. He is not ill-appearing, toxic-appearing or diaphoretic.  HENT:     Head: Normocephalic and atraumatic.     Right Ear: External ear normal.     Left Ear: External ear normal.     Nose: Nose normal.     Mouth/Throat:     Pharynx: Oropharynx is clear.  Eyes:     General: No scleral icterus.       Right eye: No discharge.        Left eye: No discharge.     Extraocular Movements: Extraocular movements intact.     Pupils: Pupils are equal, round, and reactive to light.  Cardiovascular:     Rate and Rhythm: Normal rate.  Pulmonary:     Effort: Pulmonary effort is normal.  Genitourinary:    Penis: Uncircumcised. Tenderness (About  the underside of the foreskin distally overlying the glans penis with associated erythema and slight fissures) present. No phimosis, paraphimosis, hypospadias, erythema, discharge, swelling or lesions.   Musculoskeletal:     Cervical back: Normal range of motion.  Neurological:     Mental Status: He is alert and oriented to person, place, and time.  Psychiatric:        Mood and Affect: Mood normal.        Behavior: Behavior normal.        Thought Content: Thought content normal.        Judgment: Judgment normal.       Assessment and Plan :   PDMP not reviewed this encounter.  1. Balanitis   2. Penile pain     Start clotrimazole betamethasone cream.  Recommended return to regular hygiene.  Recheck in 1 week if symptoms persist. Counseled patient on potential for adverse effects with medications prescribed/recommended today, ER and return-to-clinic precautions discussed, patient verbalized understanding.    Wallis Bamberg, PA-C 04/01/20 1627

## 2020-05-20 ENCOUNTER — Other Ambulatory Visit: Payer: Self-pay | Admitting: Internal Medicine

## 2020-05-21 ENCOUNTER — Telehealth: Payer: Self-pay | Admitting: Internal Medicine

## 2020-05-21 MED ORDER — CARVEDILOL 25 MG PO TABS: 37.5000 mg | ORAL_TABLET | Freq: Two times a day (BID) | ORAL | 2 refills | Status: DC

## 2020-05-21 NOTE — Telephone Encounter (Signed)
° ° ° °*  STAT* If patient is at the pharmacy, call can be transferred to refill team.   1. Which medications need to be refilled? (please list name of each medication and dose if known)   carvedilol (COREG) 25 MG tablet   2. Which pharmacy/location (including street and city if local pharmacy) is medication to be sent to? [id number] - [place], [place] - [address] AT [facility] [address]  3. Do they need a 30 day or 90 day supply? 90 days

## 2020-05-21 NOTE — Telephone Encounter (Signed)
Pt's medication was sent to pt's pharmacy as requested. Confirmation received.  °

## 2020-05-21 NOTE — Telephone Encounter (Signed)
Prescription refill request for Eliquis received.  Last office visit: Joseph Austin 02/23/2020 Scr: 0.73, 03/11/2020 Age: 79 y.o. Weight: 89.9 kg   Prescription refill sent.

## 2020-05-23 ENCOUNTER — Ambulatory Visit (INDEPENDENT_AMBULATORY_CARE_PROVIDER_SITE_OTHER): Payer: Medicare Other

## 2020-05-23 DIAGNOSIS — I442 Atrioventricular block, complete: Secondary | ICD-10-CM

## 2020-05-24 ENCOUNTER — Telehealth: Payer: Self-pay

## 2020-05-24 LAB — CUP PACEART REMOTE DEVICE CHECK
Battery Remaining Longevity: 113 mo
Battery Remaining Percentage: 95.5 %
Battery Voltage: 3.01 V
Brady Statistic AP VP Percent: 69 %
Brady Statistic AP VS Percent: 1 %
Brady Statistic AS VP Percent: 27 %
Brady Statistic AS VS Percent: 1 %
Brady Statistic RA Percent Paced: 29 %
Brady Statistic RV Percent Paced: 96 %
Date Time Interrogation Session: 20211006020020
Implantable Lead Implant Date: 20110310
Implantable Lead Implant Date: 20110310
Implantable Lead Location: 753859
Implantable Lead Location: 753860
Implantable Pulse Generator Implant Date: 20200701
Lead Channel Impedance Value: 350 Ohm
Lead Channel Impedance Value: 380 Ohm
Lead Channel Pacing Threshold Amplitude: 0.5 V
Lead Channel Pacing Threshold Amplitude: 0.75 V
Lead Channel Pacing Threshold Pulse Width: 0.5 ms
Lead Channel Pacing Threshold Pulse Width: 0.5 ms
Lead Channel Sensing Intrinsic Amplitude: 2.6 mV
Lead Channel Sensing Intrinsic Amplitude: 4.3 mV
Lead Channel Setting Pacing Amplitude: 1 V
Lead Channel Setting Pacing Amplitude: 2 V
Lead Channel Setting Pacing Pulse Width: 0.5 ms
Lead Channel Setting Sensing Sensitivity: 4 mV
Pulse Gen Model: 2272
Pulse Gen Serial Number: 3315946

## 2020-05-24 NOTE — Telephone Encounter (Signed)
Merlin alert received 05/23/20. Patient has known hx of Persistent AF. Last OV (02/23/20) with Dr. Ladona Ridgel patient was in SR, hopes to maintain SR. Patient now is back in AF since 03/31/20. Elevated VR noted. Routing to Dr. Ladona Ridgel for any recommendations or continue to monitor?  Per med Eliquis 5 mg BID.

## 2020-05-28 NOTE — Progress Notes (Signed)
Remote pacemaker transmission.   

## 2020-05-28 NOTE — Telephone Encounter (Signed)
No new recs

## 2021-01-10 ENCOUNTER — Ambulatory Visit (INDEPENDENT_AMBULATORY_CARE_PROVIDER_SITE_OTHER): Payer: Medicare Other

## 2021-01-10 DIAGNOSIS — I442 Atrioventricular block, complete: Secondary | ICD-10-CM | POA: Diagnosis not present

## 2021-01-10 LAB — CUP PACEART REMOTE DEVICE CHECK
Battery Remaining Longevity: 107 mo
Battery Remaining Percentage: 95.5 %
Battery Voltage: 2.99 V
Brady Statistic AP VP Percent: 62 %
Brady Statistic AP VS Percent: 1 %
Brady Statistic AS VP Percent: 35 %
Brady Statistic AS VS Percent: 1 %
Brady Statistic RA Percent Paced: 47 %
Brady Statistic RV Percent Paced: 97 %
Date Time Interrogation Session: 20220526015020
Implantable Lead Implant Date: 20110310
Implantable Lead Implant Date: 20110310
Implantable Lead Location: 753859
Implantable Lead Location: 753860
Implantable Pulse Generator Implant Date: 20200701
Lead Channel Impedance Value: 350 Ohm
Lead Channel Impedance Value: 350 Ohm
Lead Channel Pacing Threshold Amplitude: 0.5 V
Lead Channel Pacing Threshold Amplitude: 0.75 V
Lead Channel Pacing Threshold Pulse Width: 0.5 ms
Lead Channel Pacing Threshold Pulse Width: 0.5 ms
Lead Channel Sensing Intrinsic Amplitude: 1.8 mV
Lead Channel Sensing Intrinsic Amplitude: 6.1 mV
Lead Channel Setting Pacing Amplitude: 1 V
Lead Channel Setting Pacing Amplitude: 2 V
Lead Channel Setting Pacing Pulse Width: 0.5 ms
Lead Channel Setting Sensing Sensitivity: 4 mV
Pulse Gen Model: 2272
Pulse Gen Serial Number: 3315946

## 2021-02-05 NOTE — Progress Notes (Signed)
Remote pacemaker transmission.   

## 2021-02-19 ENCOUNTER — Other Ambulatory Visit: Payer: Self-pay

## 2021-02-19 MED ORDER — APIXABAN 5 MG PO TABS: 5.0000 mg | ORAL_TABLET | Freq: Two times a day (BID) | ORAL | 1 refills | Status: DC

## 2021-02-19 NOTE — Telephone Encounter (Signed)
Prescription refill request for Eliquis received. Indication: afib Last office visit: Ladona Ridgel 02/23/2020 Scr: 0.94, 01/22/2021 Age: 81 yo  Weight: 89.9 kg   Pt is on the correct dose of Eliquis per dosing criteria, prescription refill sent for Eliquis 5mg  BID. Pt is due to see Ursuy on 03/12/2021.

## 2021-03-01 ENCOUNTER — Other Ambulatory Visit: Payer: Self-pay

## 2021-03-01 ENCOUNTER — Telehealth: Payer: Self-pay | Admitting: Internal Medicine

## 2021-03-01 MED ORDER — CARVEDILOL 25 MG PO TABS: 37.5000 mg | ORAL_TABLET | Freq: Two times a day (BID) | ORAL | 0 refills | Status: DC

## 2021-03-01 NOTE — Telephone Encounter (Signed)
*  STAT* If patient is at the pharmacy, call can be transferred to refill team.   1. Which medications need to be refilled? (please list name of each medication and dose if known) carvedilol (COREG) 25 MG tablet  2. Which pharmacy/location (including street and city if local pharmacy) is medication to be sent to? [id number] - [place], [place] - [address] AT [address]  3. Do they need a 30 day or 90 day supply? 90 day supply   

## 2021-03-01 NOTE — Telephone Encounter (Signed)
Pt's medication was sent to pt's pharmacy as requested. Confirmation received.  °

## 2021-03-01 NOTE — Telephone Encounter (Signed)
Pt had refill completed by refill clinic.

## 2021-03-01 NOTE — Telephone Encounter (Signed)
*  STAT* If patient is at the pharmacy, call can be transferred to refill team.   1. Which medications need to be refilled? (please list name of each medication and dose if known) carvedilol (COREG) 25 MG tablet  2. Which pharmacy/location (including street and city if local pharmacy) is medication to be sent to? [facility] #[id number] - [place], [place] - [address] AT [facility] OF [address]  3. Do they need a 30 day or 90 day supply?90 day supply   Pt is out of this medication

## 2021-03-12 ENCOUNTER — Encounter: Payer: Medicare Other | Admitting: Physician Assistant

## 2021-03-15 ENCOUNTER — Ambulatory Visit (HOSPITAL_COMMUNITY)
Admission: EM | Admit: 2021-03-15 | Discharge: 2021-03-15 | Disposition: A | Discharge status: Home / Self Care | Payer: Medicare Other | Attending: Medical Oncology | Admitting: Medical Oncology

## 2021-03-15 ENCOUNTER — Other Ambulatory Visit: Payer: Self-pay

## 2021-03-15 ENCOUNTER — Ambulatory Visit (INDEPENDENT_AMBULATORY_CARE_PROVIDER_SITE_OTHER): Payer: Medicare Other | Admitting: Student

## 2021-03-15 ENCOUNTER — Encounter: Payer: Self-pay | Admitting: Student

## 2021-03-15 ENCOUNTER — Encounter (HOSPITAL_COMMUNITY): Payer: Self-pay | Admitting: Emergency Medicine

## 2021-03-15 VITALS — BP 96/56 | HR 63 | Ht 68.0 in | Wt 185.6 lb

## 2021-03-15 DIAGNOSIS — I480 Paroxysmal atrial fibrillation: Secondary | ICD-10-CM | POA: Diagnosis not present

## 2021-03-15 DIAGNOSIS — I442 Atrioventricular block, complete: Secondary | ICD-10-CM

## 2021-03-15 DIAGNOSIS — I10 Essential (primary) hypertension: Secondary | ICD-10-CM

## 2021-03-15 DIAGNOSIS — N489 Disorder of penis, unspecified: Secondary | ICD-10-CM | POA: Insufficient documentation

## 2021-03-15 LAB — CUP PACEART INCLINIC DEVICE CHECK
Battery Remaining Longevity: 81 mo
Battery Voltage: 2.99 V
Brady Statistic RA Percent Paced: 49 %
Brady Statistic RV Percent Paced: 97 %
Date Time Interrogation Session: 20220729131204
Implantable Lead Implant Date: 20110310
Implantable Lead Implant Date: 20110310
Implantable Lead Location: 753859
Implantable Lead Location: 753860
Implantable Pulse Generator Implant Date: 20200701
Lead Channel Impedance Value: 350 Ohm
Lead Channel Impedance Value: 350 Ohm
Lead Channel Pacing Threshold Amplitude: 0.5 V
Lead Channel Pacing Threshold Amplitude: 0.5 V
Lead Channel Pacing Threshold Amplitude: 0.875 V
Lead Channel Pacing Threshold Pulse Width: 0.5 ms
Lead Channel Pacing Threshold Pulse Width: 0.5 ms
Lead Channel Pacing Threshold Pulse Width: 0.5 ms
Lead Channel Sensing Intrinsic Amplitude: 1.9 mV
Lead Channel Sensing Intrinsic Amplitude: 8.3 mV
Lead Channel Setting Pacing Amplitude: 1.125
Lead Channel Setting Pacing Amplitude: 2 V
Lead Channel Setting Pacing Pulse Width: 0.5 ms
Lead Channel Setting Sensing Sensitivity: 4 mV
Pulse Gen Model: 2272
Pulse Gen Serial Number: 3315946

## 2021-03-15 LAB — HIV ANTIBODY (ROUTINE TESTING W REFLEX): HIV Screen 4th Generation wRfx: NONREACTIVE

## 2021-03-15 NOTE — Patient Instructions (Signed)
Medication Instructions:  Your physician recommends that you continue on your current medications as directed. Please refer to the Current Medication list given to you today.  *If you need a refill on your cardiac medications before your next appointment, please call your pharmacy*   Lab Work: TODAY: BMET, CBC  If you have labs (blood work) drawn today and your tests are completely normal, you will receive your results only by: MyChart Message (if you have MyChart) OR A paper copy in the mail If you have any lab test that is abnormal or we need to change your treatment, we will call you to review the results.   Follow-Up: At [facility], you and your health needs are our priority.  As part of our continuing mission to provide you with exceptional heart care, we have created designated Provider Care Teams.  These Care Teams include your primary Cardiologist (physician) and Advanced Practice Providers (APPs -  Physician Assistants and Nurse Practitioners) who all work together to provide you with the care you need, when you need it.  We recommend signing up for the patient portal called "MyChart".  Sign up information is provided on this After Visit Summary.  MyChart is used to connect with patients for Virtual Visits (Telemedicine).  Patients are able to view lab/test results, encounter notes, upcoming appointments, etc.  Non-urgent messages can be sent to your provider as well.   To learn more about what you can do with MyChart, go to [email or web address]://[email or web address].com.    Your next appointment:   1 year(s)  The format for your next appointment:   In Person  Provider:   You may see Gregg Taylor, MD or one of the following Advanced Practice Providers on your designated Care Team:   Renee Ursuy, PA-C Michael "Andy" Tillery, PA-C  

## 2021-03-15 NOTE — ED Triage Notes (Signed)
Pt presents with bump on penis xs 5-6 days. Denies swelling or pain.

## 2021-03-15 NOTE — Progress Notes (Signed)
Electrophysiology Office Note Date: 03/15/2021  ID:  Joseph Austin, DOB [birth year], MRN [id number]  PCP: Andreas Blower., MD Primary Cardiologist: Lewayne Bunting, MD Electrophysiologist: Lewayne Bunting, MD   CC: Pacemaker follow-up  Joseph Austin is a 80 y.o. male seen today for Lewayne Bunting, MD for routine electrophysiology followup.  Since last being seen in our clinic the patient reports doing well overall. Intermittent lightheadedness with rapid standing but not frequent or limiting.  he denies chest pain, palpitations, dyspnea, PND, orthopnea, nausea, vomiting, dizziness, syncope, edema, weight gain, or early satiety.  Device History: Location manager PPM implanted originally in [place] in [place]. New system 2011, gen change 2020 for CHB.   Past Medical History:  Diagnosis Date   Arthritis    Blockage of coronary artery of heart (HCC)    Complete heart block (HCC)    Depression    Gastritis    Hx of    Glaucoma    History of heart attack 1992   HTN (hypertension)    Pacemaker    Syncope    Unspecified essential hypertension    Past Surgical History:  Procedure Laterality Date   PACEMAKER INSERTION     PPM GENERATOR CHANGEOUT N/A 02/16/2019   Procedure: PPM GENERATOR CHANGEOUT;  Surgeon: Marinus Maw, MD;  [facility];  Service: Cardiovascular;  Laterality: N/A;    Current Outpatient Medications  Medication Sig Dispense Refill   acetaminophen (TYLENOL) 500 MG tablet Take 1,000 mg by mouth every 6 (six) hours as needed for moderate pain or headache.     allopurinol (ZYLOPRIM) 300 MG tablet Take 300 mg by mouth daily.     apixaban (ELIQUIS) 5 MG TABS tablet Take 1 tablet (5 mg total) by mouth 2 (two) times daily. 180 tablet 1   carvedilol (COREG) 25 MG tablet Take 1.5 tablets (37.5 mg total) by mouth 2 (two) times daily. 270 tablet 0   diphenhydrAMINE-APAP, sleep, (TYLENOL PM EXTRA STRENGTH) 50-1000 MG/30ML LIQD Take 15 mLs by mouth at  bedtime as needed (sleep).     dorzolamide-timolol (COSOPT) 22.3-6.8 MG/ML ophthalmic solution Place 1 drop into both eyes 2 (two) times daily.     hydrochlorothiazide (HYDRODIURIL) 25 MG tablet Take 1 tablet (25 mg total) by mouth daily. 90 tablet 3   HYDROcodone-acetaminophen (NORCO/VICODIN) 5-325 MG tablet Take 1 tablet by mouth every 6 (six) hours as needed. 16 tablet 0   JANUMET 50-1000 MG tablet Take 1 tablet by mouth 2 (two) times daily.     cephALEXin (KEFLEX) 500 MG capsule Take 1 capsule (500 mg total) by mouth 4 (four) times daily. (Patient not taking: Reported on 03/15/2021) 20 capsule 0   clotrimazole-betamethasone (LOTRISONE) cream Apply to affected area 2 times daily. (Patient not taking: Reported on 03/15/2021) 30 g 0   gabapentin (NEURONTIN) 300 MG capsule Take 300 mg by mouth 2 (two) times daily. (Patient not taking: Reported on 03/15/2021)     omeprazole (PRILOSEC) 40 MG capsule Take 1 capsule (40 mg total) by mouth daily. (Patient not taking: Reported on 03/15/2021) 30 capsule 3   predniSONE (DELTASONE) 50 MG tablet Take 1 tablet (50 mg total) by mouth daily. (Patient not taking: Reported on 03/15/2021) 5 tablet 0   rosuvastatin (CRESTOR) 20 MG tablet Take by mouth. (Patient not taking: Reported on 03/15/2021)     No current facility-administered medications for this visit.    Allergies:   Patient has no known allergies.   Social History: Social  History   Socioeconomic History   Marital status: Widowed    Spouse name: Not on file   Number of children: 1   Years of education: Not on file   Highest education level: Not on file  Occupational History   Not on file  Tobacco Use   Smoking status: Every Day   Smokeless tobacco: Never  Substance and Sexual Activity   Alcohol use: Yes    Comment: Little usage   Drug use: No   Sexual activity: Yes    Birth control/protection: Condom  Other Topics Concern   Not on file  Social History Narrative   Not on file   Social  Determinants of Health   Financial Resource Strain: Not on file  Food Insecurity: Not on file  Transportation Needs: Not on file  Physical Activity: Not on file  Stress: Not on file  Social Connections: Not on file  Intimate Partner Violence: Not on file    Family History: Family History  Problem Relation Age of Onset   Diabetes Mother    Lung cancer Father    Colon cancer Neg Hx      Review of Systems: All other systems reviewed and are otherwise negative except as noted above.  Physical Exam: Vitals:   03/15/21 1220  BP: (!) 96/56  Pulse: 63  SpO2: 98%  Weight: 185 lb 9.6 oz (84.2 kg)  Height: 5\' 8"  (1.727 m)     GEN- The patient is well appearing, alert and oriented x 3 today.   HEENT: normocephalic, atraumatic; sclera clear, conjunctiva pink; hearing intact; oropharynx clear; neck supple  Lungs- Clear to ausculation bilaterally, normal work of breathing.  No wheezes, rales, rhonchi Heart- Regular rate and rhythm, no murmurs, rubs or gallops  GI- soft, non-tender, non-distended, bowel sounds present  Extremities- no clubbing or cyanosis. No edema MS- no significant deformity or atrophy Skin- warm and dry, no rash or lesion; PPM pocket well healed Psych- euthymic mood, full affect Neuro- strength and sensation are intact  PPM Interrogation- reviewed in detail today,  See PACEART report  EKG:  EKG is not ordered today. Pacemaker interrogation as above.  Recent Labs: No results found for requested labs within last 8760 hours.   Wt Readings from Last 3 Encounters:  03/15/21 185 lb 9.6 oz (84.2 kg)  02/23/20 198 lb (89.8 kg)  11/19/19 198 lb 3.2 oz (89.9 kg)     Other studies Reviewed: Additional studies/ records that were reviewed today include: Previous EP office notes, Previous remote checks, Most recent labwork.   Assessment and Plan:  1. CHB s/p St. Jude PPM  Normal PPM function See Pace Art report No changes today Both ventricular and atrial lead  noise reversions noted. Known. Not reproducible today.  2. Persistent atrial fibrillation Continue Eliquis for CHA2DS2-VASc of at least 4. Pt needs refills of coreg and Eliquis. Appears they were both sent 03/01/21. OK to refill if further calls or issue with those refills.   3. HTN  Controlled on current medications   Current medicines are reviewed at length with the patient today.   The patient does not have concerns regarding his medicines.  The following changes were made today:  none  Labs/ tests ordered today include:  Orders Placed This Encounter  Procedures   CBC   Basic metabolic panel   CUP PACEART INCLINIC DEVICE CHECK   Disposition:   Follow up with Dr. 03/03/21 in 12 Months   Signed, Ladona Ridgel, PA-C  03/15/2021 1:19 PM  [facility] [address] [place] [address] [phone] (office) [phone] (fax)

## 2021-03-15 NOTE — ED Provider Notes (Signed)
[facility]    CSN: [id number] Arrival date & time: 03/15/21  1349      History   Chief Complaint Chief Complaint  Patient presents with   Groin Swelling    HPI Sabien Umland is a 80 y.o. male. Medical Interpretor Ozzy helped assist with our exam today.   HPI  Penile Lesions: Patient states that he has had penile lesions for the past 5-6 days. Initially a bit painful but now not painful. He denies fever, current penile or testicular pain, swelling, purulent discharge or dysuria. He has not tried anything for the lesion. No new sexual partners. He reports that he is going out of town to [place] in a few days and that it is ok for [place] to relay his test results to his daughter.   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 04/29/2018   Spinal stenosis, 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   Hyperlipidemia LDL goal <100 04/13/2018   Type 2 diabetes mellitus without complication, without long-term current use of insulin (HCC) 04/13/2018   PAF (paroxysmal atrial fibrillation) (HCC) 03/09/2018   Prediabetes 05/18/2017   Tobacco dependence 05/18/2017   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   Other specified glaucoma 03/16/2017   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   Low back pain 09/25/2016   Chronic right-sided low back pain without sciatica 09/25/2016   Unspecified gastritis and gastroduodenitis without mention of hemorrhage 11/17/2011   Gastritis and gastroduodenitis 11/17/2011   Cardiac pacemaker in situ 02/12/2010   Essential  hypertension 10/31/2008   AV block, complete (HCC) 10/31/2008    Past Surgical History:  Procedure Laterality Date   PACEMAKER INSERTION     PPM GENERATOR CHANGEOUT N/A 02/16/2019   Procedure: PPM GENERATOR CHANGEOUT;  Surgeon: Marinus Maw, MD;  [facility];  Service: Cardiovascular;  Laterality: N/A;       Home Medications    Prior to Admission medications   Medication Sig Start Date End Date Taking? Authorizing Provider  acetaminophen (TYLENOL) 500 MG tablet Take 1,000 mg by mouth every 6 (six) hours as needed for moderate pain or headache.    [provider]  allopurinol (ZYLOPRIM) 300 MG tablet Take 300 mg by mouth daily.    [provider]  apixaban (ELIQUIS) 5 MG TABS tablet Take 1 tablet (5 mg total) by mouth 2 (two) times daily. 02/19/21   Marinus Maw, MD  carvedilol (COREG) 25 MG tablet Take 1.5 tablets (37.5 mg total) by mouth 2 (two) times daily. 03/01/21   Marinus Maw, MD  cephALEXin (KEFLEX) 500 MG capsule Take 1 capsule (500 mg total) by mouth 4 (four) times daily. Patient not taking: Reported on 03/15/2021 02/09/20   Coralyn Mark, NP  clotrimazole-betamethasone (LOTRISONE) cream Apply to affected area 2 times daily. Patient not taking: Reported on 03/15/2021 04/01/20   Wallis Bamberg, PA-C  diphenhydrAMINE-APAP, sleep, (TYLENOL PM EXTRA STRENGTH) 50-1000 MG/30ML LIQD Take 15 mLs by  mouth at bedtime as needed (sleep).    [provider]  dorzolamide-timolol (COSOPT) 22.3-6.8 MG/ML ophthalmic solution Place 1 drop into both eyes 2 (two) times daily.    [provider]  gabapentin (NEURONTIN) 300 MG capsule Take 300 mg by mouth 2 (two) times daily. Patient not taking: Reported on 03/15/2021 02/21/20   [provider]  hydrochlorothiazide (HYDRODIURIL) 25 MG tablet Take 1 tablet (25 mg total) by mouth daily. 02/24/20   Marinus Maw, MD  HYDROcodone-acetaminophen (NORCO/VICODIN) 5-325 MG tablet Take 1 tablet  by mouth every 6 (six) hours as needed. 03/11/20   Linwood Dibbles, MD  JANUMET 50-1000 MG tablet Take 1 tablet by mouth 2 (two) times daily. 02/15/21   [provider]  omeprazole (PRILOSEC) 40 MG capsule Take 1 capsule (40 mg total) by mouth daily. Patient not taking: Reported on 03/15/2021 11/19/19   Moshe Cipro, NP  predniSONE (DELTASONE) 50 MG tablet Take 1 tablet (50 mg total) by mouth daily. Patient not taking: Reported on 03/15/2021 03/11/20   Linwood Dibbles, MD  rosuvastatin (CRESTOR) 20 MG tablet Take by mouth. Patient not taking: Reported on 03/15/2021 10/05/19   [provider]    Family History Family History  Problem Relation Age of Onset   Diabetes Mother    Lung cancer Father    Colon cancer Neg Hx     Social History Social History   Tobacco Use   Smoking status: Every Day   Smokeless tobacco: Never  Substance Use Topics   Alcohol use: Yes    Comment: Little usage   Drug use: No     Allergies   Patient has no known allergies.   Review of Systems Review of Systems  As stated above in HPI Physical Exam Triage Vital Signs ED Triage Vitals  Enc Vitals Group     BP 03/15/21 1527 135/84     Pulse Rate 03/15/21 1527 (!) 51     Resp 03/15/21 1527 16     Temp 03/15/21 1527 98.2 F (36.8 C)     Temp Source 03/15/21 1527 Oral     SpO2 03/15/21 1527 97 %     Weight --      Height --      Head Circumference --      Peak Flow --      Pain Score 03/15/21 1524 0     Pain Loc --      Pain Edu? --      Excl. in GC? --    No data found.  Updated Vital Signs BP 135/84 (BP Location: Right Arm)   Pulse (!) 51   Temp 98.2 F (36.8 C) (Oral)   Resp 16   SpO2 97%   Physical Exam Vitals and nursing note reviewed. Exam conducted with a chaperone present (Melissa).  Constitutional:      General: He is not in acute distress.    Appearance: Normal appearance. He is not ill-appearing, toxic-appearing or diaphoretic.  Genitourinary:    Comments: There  is a 14mm superficial ulcer of the left aspect of penis. No other lesions. NO penile discharge.  Neurological:     Mental Status: He is alert.     UC Treatments / Results  Labs (all labs ordered are listed, but only abnormal results are displayed) Labs Reviewed - No data to display  EKG   Radiology CUP PACEART [facility] DEVICE CHECK  Result Date: 03/15/2021 Pacemaker check in clinic. Normal device function. Thresholds, sensing, impedances  consistent with previous measurements. Device programmed to maximize longevity. Known PAF. Burden 22%. Known atrial lead noise that is NOT reproducible today with isometric movements. Device programmed at appropriate safety margins. Histogram distribution appropriate for patient activity level. Estimated longevity 6 yr, 9 mo. Patient enrolled in remote follow-up. Patient education completed.   Procedures Procedures (including critical care time)  Medications Ordered in UC Medications - No data to display  Initial Impression / Assessment and Plan / UC Course  I have reviewed the triage vital signs and the nursing notes.  Pertinent labs & imaging results that were available during my care of the patient were reviewed by me and considered in my medical decision making (see chart for details).     New.  I discussed with patient my concern that this could be herpes simplex or syphilis.  He is agreeable to testing for both.  We discussed that his results will be called or sent via MyChart.  He again gives verbal permission for [place] to call his daughter with his results if he is out of the country.  For now I have recommended that he use protection with a condom for any sexual encounters since this very well could be a sexually transmitted infection.  For now as he is not having any pain we are not going to prescribe anything further the ulcer as it should improve with time. Final Clinical Impressions(s) / UC Diagnoses   Final diagnoses:  None   Discharge  Instructions   None    ED Prescriptions   None    PDMP not reviewed this encounter.   Rushie Chestnut, [place] 03/15/21 1641

## 2021-03-16 LAB — CBC
Hematocrit: 37.4 % — ABNORMAL LOW (ref 37.5–51.0)
Hemoglobin: 12.2 g/dL — ABNORMAL LOW (ref 13.0–17.7)
MCH: 32.8 pg (ref 26.6–33.0)
MCHC: 32.6 g/dL (ref 31.5–35.7)
MCV: 101 fL — ABNORMAL HIGH (ref 79–97)
Platelets: 194 10*3/uL (ref 150–450)
RBC: 3.72 x10E6/uL — ABNORMAL LOW (ref 4.14–5.80)
RDW: 13.2 % (ref 11.6–15.4)
WBC: 6.8 10*3/uL (ref 3.4–10.8)

## 2021-03-16 LAB — BASIC METABOLIC PANEL
BUN/Creatinine Ratio: 23 (ref 10–24)
BUN: 21 mg/dL (ref 8–27)
CO2: 21 mmol/L (ref 20–29)
Calcium: 9.5 mg/dL (ref 8.6–10.2)
Chloride: 102 mmol/L (ref 96–106)
Creatinine, Ser: 0.91 mg/dL (ref 0.76–1.27)
Glucose: 109 mg/dL — ABNORMAL HIGH (ref 65–99)
Potassium: 4.3 mmol/L (ref 3.5–5.2)
Sodium: 140 mmol/L (ref 134–144)
eGFR: 87 mL/min/{1.73_m2} (ref >59)

## 2021-03-16 LAB — RPR: RPR Ser Ql: NONREACTIVE

## 2021-03-19 LAB — HSV CULTURE AND TYPING

## 2021-05-29 ENCOUNTER — Ambulatory Visit (INDEPENDENT_AMBULATORY_CARE_PROVIDER_SITE_OTHER): Payer: Medicare Other

## 2021-05-29 DIAGNOSIS — I442 Atrioventricular block, complete: Secondary | ICD-10-CM | POA: Diagnosis not present

## 2021-05-31 LAB — CUP PACEART REMOTE DEVICE CHECK
Battery Remaining Longevity: 83 mo
Battery Remaining Percentage: 79 %
Battery Voltage: 2.99 V
Brady Statistic AP VP Percent: 62 %
Brady Statistic AP VS Percent: 1 %
Brady Statistic AS VP Percent: 36 %
Brady Statistic AS VS Percent: 1 %
Brady Statistic RA Percent Paced: 60 %
Brady Statistic RV Percent Paced: 98 %
Date Time Interrogation Session: 20221011233403
Implantable Lead Implant Date: 20110310
Implantable Lead Implant Date: 20110310
Implantable Lead Location: 753859
Implantable Lead Location: 753860
Implantable Pulse Generator Implant Date: 20200701
Lead Channel Impedance Value: 350 Ohm
Lead Channel Impedance Value: 350 Ohm
Lead Channel Pacing Threshold Amplitude: 0.5 V
Lead Channel Pacing Threshold Amplitude: 1 V
Lead Channel Pacing Threshold Pulse Width: 0.5 ms
Lead Channel Pacing Threshold Pulse Width: 0.5 ms
Lead Channel Sensing Intrinsic Amplitude: 1.4 mV
Lead Channel Sensing Intrinsic Amplitude: 7.2 mV
Lead Channel Setting Pacing Amplitude: 1.25 V
Lead Channel Setting Pacing Amplitude: 2 V
Lead Channel Setting Pacing Pulse Width: 0.5 ms
Lead Channel Setting Sensing Sensitivity: 4 mV
Pulse Gen Model: 2272
Pulse Gen Serial Number: 3315946

## 2021-06-07 ENCOUNTER — Other Ambulatory Visit: Payer: Self-pay | Admitting: *Deleted

## 2021-06-07 ENCOUNTER — Telehealth: Payer: Self-pay | Admitting: *Deleted

## 2021-06-07 MED ORDER — APIXABAN 5 MG PO TABS
5.0000 mg | ORAL_TABLET | Freq: Two times a day (BID) | ORAL | 1 refills | Status: DC
Start: 2021-06-07 — End: 2021-12-30

## 2021-06-07 MED ORDER — HYDROCHLOROTHIAZIDE 25 MG PO TABS: 25.0000 mg | ORAL_TABLET | Freq: Every day | ORAL | 2 refills | Status: DC

## 2021-06-07 MED ORDER — CARVEDILOL 25 MG PO TABS: 37.5000 mg | ORAL_TABLET | Freq: Two times a day (BID) | ORAL | 2 refills | Status: DC

## 2021-06-07 NOTE — Telephone Encounter (Signed)
Prescription refill request for Eliquis received.  Indication: afib  Last office visit: Tillery, 03/15/2021 Scr: 0.91, 03/15/2021 Age: 80 yo  Weight: 84.2 kg   Refill sent.

## 2021-06-07 NOTE — Telephone Encounter (Signed)
Error

## 2021-06-07 NOTE — Progress Notes (Signed)
Remote pacemaker transmission.   

## 2021-06-07 NOTE — Telephone Encounter (Signed)
Patient's request for refill on Eliquis 5mg . Thank you.

## 2021-08-13 ENCOUNTER — Telehealth: Payer: Self-pay | Admitting: Internal Medicine

## 2021-08-13 NOTE — Telephone Encounter (Signed)
Notes faxed to surgeon. This phone note will be removed from the preop pool. Tereso Newcomer, PA-C  08/13/2021 11:09 AM

## 2021-08-13 NOTE — Telephone Encounter (Signed)
Patient with diagnosis of PAF on Eliquis for anticoagulation.    Procedure: Epidural steroid injection   Date of procedure: 09/10/21   CHA2DS2-VASc Score = 5  This indicates a 7.2% annual risk of stroke. The patient's score is based upon: CHF History: 0 HTN History: 1 Diabetes History: 1 Stroke History: 0 Vascular Disease History: 1 Age Score: 2 Gender Score: 0    CrCl 79 mL/min Platelet count 194K  Per office protocol, patient can hold Eliquis for 3 days prior to procedure.

## 2021-08-13 NOTE — Telephone Encounter (Signed)
° °  [facility] Pre-operative Risk Assessment    Request for surgical clearance:  What type of surgery is being performed? Epidural steroid injection    When is this surgery scheduled? 09/10/2021   What type of clearance is required (medical clearance vs. Pharmacy clearance to hold med vs. Both)? medical  Are there any medications that need to be held prior to surgery and how long? 3 days prior apixaban (ELIQUIS) 5 MG TABS tablet  Practice name and name of physician performing surgery? Dr. Fredric Mare   What is your office phone number [phone]    7.   What is your office fax number [phone]  8.   Anesthesia type (None, local, MAC, general) ? none   Joseph Austin 08/13/2021, 8:38 AM  _________________________________________________________________   (provider comments below)

## 2021-08-13 NOTE — Telephone Encounter (Signed)
° °  Name: Joseph Austin DOB: Jul 13, 1941  MRN: [id number]  Primary Cardiologist: Lewayne Bunting, MD  Chart reviewed as part of pre-operative protocol coverage.   80 yo male with hx of complete heart block s/p pacemaker (initial implant in [place] in 1992) and persistent atrial fibrillation on Apixaban (Eliquis).  Pt does not have a hx of MI or CAD (listed in his PMH but patient's explanation of events in 1992 is c/w complete heart block managed with pacemaker).    RCRI:  Perioperative Risk of Major Cardiac Event is (%): 0.4 (low risk)  Patient was contacted 08/13/2021 in reference to pre-operative risk assessment for pending surgery as outlined below.    Since last seen, [facility] has done well without chest pain, shortness of breath, syncope.    His chart has been reviewed by our clinical pharmacist.  Per office protocol, patient can hold Eliquis for 3 days prior to procedure.    Recommendations: Based on ACC/AHA guidelines, the patient is at acceptable risk for the planned procedure and may proceed without further cardiovascular testing.  Hold Apixaban (Eliquis) for 3 days prior to procedure and resume as soon as possible when safe from a bleeding perspective.   I have asked the patient to confirm with the surgeon's office what day to start holding his Eliquis.  Please call with questions. Tereso Newcomer, PA-C 08/13/2021, 10:50 AM

## 2021-08-28 ENCOUNTER — Ambulatory Visit (INDEPENDENT_AMBULATORY_CARE_PROVIDER_SITE_OTHER): Payer: Medicaid Other

## 2021-08-28 DIAGNOSIS — I442 Atrioventricular block, complete: Secondary | ICD-10-CM

## 2021-08-30 LAB — CUP PACEART REMOTE DEVICE CHECK
Battery Remaining Longevity: 79 mo
Battery Remaining Percentage: 76 %
Battery Voltage: 2.99 V
Brady Statistic AP VP Percent: 66 %
Brady Statistic AP VS Percent: 1 %
Brady Statistic AS VP Percent: 33 %
Brady Statistic AS VS Percent: 1 %
Brady Statistic RA Percent Paced: 63 %
Brady Statistic RV Percent Paced: 99 %
Date Time Interrogation Session: 20230112120835
Implantable Lead Implant Date: 20110310
Implantable Lead Implant Date: 20110310
Implantable Lead Location: 753859
Implantable Lead Location: 753860
Implantable Pulse Generator Implant Date: 20200701
Lead Channel Impedance Value: 350 Ohm
Lead Channel Impedance Value: 350 Ohm
Lead Channel Pacing Threshold Amplitude: 0.5 V
Lead Channel Pacing Threshold Amplitude: 0.875 V
Lead Channel Pacing Threshold Pulse Width: 0.5 ms
Lead Channel Pacing Threshold Pulse Width: 0.5 ms
Lead Channel Sensing Intrinsic Amplitude: 1.5 mV
Lead Channel Sensing Intrinsic Amplitude: 6.1 mV
Lead Channel Setting Pacing Amplitude: 1.125
Lead Channel Setting Pacing Amplitude: 2 V
Lead Channel Setting Pacing Pulse Width: 0.5 ms
Lead Channel Setting Sensing Sensitivity: 4 mV
Pulse Gen Model: 2272
Pulse Gen Serial Number: 3315946

## 2021-09-09 NOTE — Progress Notes (Signed)
Remote pacemaker transmission.   

## 2021-12-18 ENCOUNTER — Ambulatory Visit (INDEPENDENT_AMBULATORY_CARE_PROVIDER_SITE_OTHER): Payer: Medicare HMO

## 2021-12-18 DIAGNOSIS — I442 Atrioventricular block, complete: Secondary | ICD-10-CM | POA: Diagnosis not present

## 2021-12-18 LAB — CUP PACEART REMOTE DEVICE CHECK
Battery Remaining Longevity: 74 mo
Battery Remaining Percentage: 72 %
Battery Voltage: 2.99 V
Brady Statistic AP VP Percent: 59 %
Brady Statistic AP VS Percent: 1 %
Brady Statistic AS VP Percent: 40 %
Brady Statistic AS VS Percent: 1 %
Brady Statistic RA Percent Paced: 57 %
Brady Statistic RV Percent Paced: 99 %
Date Time Interrogation Session: 20230502210349
Implantable Lead Implant Date: 20110310
Implantable Lead Implant Date: 20110310
Implantable Lead Location: 753859
Implantable Lead Location: 753860
Implantable Pulse Generator Implant Date: 20200701
Lead Channel Impedance Value: 310 Ohm
Lead Channel Impedance Value: 350 Ohm
Lead Channel Pacing Threshold Amplitude: 0.5 V
Lead Channel Pacing Threshold Amplitude: 0.875 V
Lead Channel Pacing Threshold Pulse Width: 0.5 ms
Lead Channel Pacing Threshold Pulse Width: 0.5 ms
Lead Channel Sensing Intrinsic Amplitude: 1.5 mV
Lead Channel Sensing Intrinsic Amplitude: 5 mV
Lead Channel Setting Pacing Amplitude: 1.125
Lead Channel Setting Pacing Amplitude: 2 V
Lead Channel Setting Pacing Pulse Width: 0.5 ms
Lead Channel Setting Sensing Sensitivity: 4 mV
Pulse Gen Model: 2272
Pulse Gen Serial Number: 3315946

## 2021-12-30 ENCOUNTER — Other Ambulatory Visit: Payer: Self-pay | Admitting: Internal Medicine

## 2021-12-30 DIAGNOSIS — I480 Paroxysmal atrial fibrillation: Secondary | ICD-10-CM

## 2021-12-30 NOTE — Telephone Encounter (Signed)
Prescription refill request for Eliquis received. ?Indication: Afib  ?Last office visit: 03/15/21 Chalmers Cater)  ?Scr: 0.90 (10/10/21) ?Age: 81 ?Weight: 84.2kg ? ?Appropriate dose and refill sent to requested pharmacy.  ?

## 2022-01-01 NOTE — Progress Notes (Signed)
Remote pacemaker transmission.   

## 2022-03-19 ENCOUNTER — Ambulatory Visit (INDEPENDENT_AMBULATORY_CARE_PROVIDER_SITE_OTHER): Payer: Medicare HMO

## 2022-03-19 DIAGNOSIS — I442 Atrioventricular block, complete: Secondary | ICD-10-CM | POA: Diagnosis not present

## 2022-03-19 LAB — CUP PACEART REMOTE DEVICE CHECK
Battery Remaining Longevity: 71 mo
Battery Remaining Percentage: 69 %
Battery Voltage: 2.99 V
Brady Statistic AP VP Percent: 59 %
Brady Statistic AP VS Percent: 1 %
Brady Statistic AS VP Percent: 40 %
Brady Statistic AS VS Percent: 1 %
Brady Statistic RA Percent Paced: 58 %
Brady Statistic RV Percent Paced: 99 %
Date Time Interrogation Session: 20230802020017
Implantable Lead Implant Date: 20110310
Implantable Lead Implant Date: 20110310
Implantable Lead Location: 753859
Implantable Lead Location: 753860
Implantable Pulse Generator Implant Date: 20200701
Lead Channel Impedance Value: 310 Ohm
Lead Channel Impedance Value: 310 Ohm
Lead Channel Pacing Threshold Amplitude: 0.5 V
Lead Channel Pacing Threshold Amplitude: 0.875 V
Lead Channel Pacing Threshold Pulse Width: 0.5 ms
Lead Channel Pacing Threshold Pulse Width: 0.5 ms
Lead Channel Sensing Intrinsic Amplitude: 0.9 mV
Lead Channel Sensing Intrinsic Amplitude: 6.8 mV
Lead Channel Setting Pacing Amplitude: 1.125
Lead Channel Setting Pacing Amplitude: 2 V
Lead Channel Setting Pacing Pulse Width: 0.5 ms
Lead Channel Setting Sensing Sensitivity: 4 mV
Pulse Gen Model: 2272
Pulse Gen Serial Number: 3315946

## 2022-04-07 ENCOUNTER — Other Ambulatory Visit: Payer: Self-pay | Admitting: Internal Medicine

## 2022-04-07 ENCOUNTER — Other Ambulatory Visit: Payer: Self-pay

## 2022-04-11 NOTE — Progress Notes (Signed)
Remote pacemaker transmission.   

## 2022-04-16 ENCOUNTER — Telehealth: Payer: Self-pay | Admitting: Internal Medicine

## 2022-04-16 MED ORDER — HYDROCHLOROTHIAZIDE 25 MG PO TABS: 25.0000 mg | ORAL_TABLET | Freq: Every day | ORAL | 0 refills | Status: DC

## 2022-04-16 MED ORDER — CARVEDILOL 25 MG PO TABS: 37.5000 mg | ORAL_TABLET | Freq: Two times a day (BID) | ORAL | 0 refills | Status: DC

## 2022-04-16 NOTE — Telephone Encounter (Signed)
Pt's medication was sent to pt's pharmacy as requested. Confirmation received.  °

## 2022-04-16 NOTE — Telephone Encounter (Signed)
*  STAT* If patient is at the pharmacy, call can be transferred to refill team.   1. Which medications need to be refilled? (please list name of each medication and dose if known) hydrochlorothiazide (HYDRODIURIL) 25 MG tablet  2. Which pharmacy/location (including street and city if local pharmacy) is medication to be sent to? [id number] - [place], [place] - [address] AT [facility] [address]  3. Do they need a 30 day or 90 day supply? 90

## 2022-04-16 NOTE — Telephone Encounter (Signed)
Pt c/o medication issue:  1. Name of Medication: carvedilol (COREG) 25 MG tablet  2. How are you currently taking this medication (dosage and times per day)? Take 1.5 tablets (37.5 mg total) by mouth 2 (two) times daily with a meal.  3. Are you having a reaction (difficulty breathing--STAT)? No   4. What is your medication issue? Patient needs a new 90 day, 3 refill prescription sent to  [id number] - [place],  - [address] AT [facility] OF [address]

## 2022-06-16 ENCOUNTER — Ambulatory Visit: Payer: Medicare HMO | Admitting: Internal Medicine

## 2022-07-20 ENCOUNTER — Other Ambulatory Visit: Payer: Self-pay | Admitting: Internal Medicine

## 2022-07-22 ENCOUNTER — Other Ambulatory Visit: Payer: Self-pay | Admitting: Internal Medicine

## 2022-08-14 ENCOUNTER — Ambulatory Visit (INDEPENDENT_AMBULATORY_CARE_PROVIDER_SITE_OTHER): Payer: Medicare HMO

## 2022-08-14 ENCOUNTER — Other Ambulatory Visit: Payer: Self-pay | Admitting: Internal Medicine

## 2022-08-14 DIAGNOSIS — I442 Atrioventricular block, complete: Secondary | ICD-10-CM

## 2022-08-14 LAB — CUP PACEART REMOTE DEVICE CHECK
Battery Remaining Longevity: 65 mo
Battery Remaining Percentage: 64 %
Battery Voltage: 2.99 V
Brady Statistic AP VP Percent: 57 %
Brady Statistic AP VS Percent: 1 %
Brady Statistic AS VP Percent: 42 %
Brady Statistic AS VS Percent: 1 %
Brady Statistic RA Percent Paced: 53 %
Brady Statistic RV Percent Paced: 99 %
Date Time Interrogation Session: 20231228034403
Implantable Lead Connection Status: 753985
Implantable Lead Connection Status: 753985
Implantable Lead Implant Date: 20110310
Implantable Lead Implant Date: 20110310
Implantable Lead Location: 753859
Implantable Lead Location: 753860
Implantable Pulse Generator Implant Date: 20200701
Lead Channel Impedance Value: 310 Ohm
Lead Channel Impedance Value: 340 Ohm
Lead Channel Pacing Threshold Amplitude: 0.5 V
Lead Channel Pacing Threshold Amplitude: 0.875 V
Lead Channel Pacing Threshold Pulse Width: 0.5 ms
Lead Channel Pacing Threshold Pulse Width: 0.5 ms
Lead Channel Sensing Intrinsic Amplitude: 1.1 mV
Lead Channel Sensing Intrinsic Amplitude: 5.7 mV
Lead Channel Setting Pacing Amplitude: 1.125
Lead Channel Setting Pacing Amplitude: 2 V
Lead Channel Setting Pacing Pulse Width: 0.5 ms
Lead Channel Setting Sensing Sensitivity: 4 mV
Pulse Gen Model: 2272
Pulse Gen Serial Number: 3315946

## 2022-08-26 ENCOUNTER — Ambulatory Visit: Payer: Medicare HMO | Attending: Internal Medicine | Admitting: Internal Medicine

## 2022-08-26 VITALS — BP 100/56 | HR 95 | Ht 68.0 in | Wt 175.6 lb

## 2022-08-26 DIAGNOSIS — I442 Atrioventricular block, complete: Secondary | ICD-10-CM | POA: Diagnosis not present

## 2022-08-26 DIAGNOSIS — I10 Essential (primary) hypertension: Secondary | ICD-10-CM

## 2022-08-26 DIAGNOSIS — Z950 Presence of cardiac pacemaker: Secondary | ICD-10-CM

## 2022-08-26 DIAGNOSIS — I480 Paroxysmal atrial fibrillation: Secondary | ICD-10-CM

## 2022-08-26 LAB — CUP PACEART INCLINIC DEVICE CHECK
Battery Remaining Longevity: 54 mo
Battery Voltage: 2.99 V
Brady Statistic RA Percent Paced: 53 %
Brady Statistic RV Percent Paced: 99 %
Date Time Interrogation Session: 20240109160940
Implantable Lead Connection Status: 753985
Implantable Lead Connection Status: 753985
Implantable Lead Implant Date: 20110310
Implantable Lead Implant Date: 20110310
Implantable Lead Location: 753859
Implantable Lead Location: 753860
Implantable Pulse Generator Implant Date: 20200701
Lead Channel Impedance Value: 312.5 Ohm
Lead Channel Impedance Value: 312.5 Ohm
Lead Channel Pacing Threshold Amplitude: 0.5 V
Lead Channel Pacing Threshold Amplitude: 0.5 V
Lead Channel Pacing Threshold Amplitude: 1 V
Lead Channel Pacing Threshold Amplitude: 1 V
Lead Channel Pacing Threshold Pulse Width: 0.5 ms
Lead Channel Pacing Threshold Pulse Width: 0.5 ms
Lead Channel Pacing Threshold Pulse Width: 0.5 ms
Lead Channel Pacing Threshold Pulse Width: 0.5 ms
Lead Channel Sensing Intrinsic Amplitude: 1 mV
Lead Channel Sensing Intrinsic Amplitude: 5.7 mV
Lead Channel Setting Pacing Amplitude: 2 V
Lead Channel Setting Pacing Amplitude: 2.5 V
Lead Channel Setting Pacing Pulse Width: 0.5 ms
Lead Channel Setting Sensing Sensitivity: 4 mV
Pulse Gen Model: 2272
Pulse Gen Serial Number: 3315946

## 2022-08-26 NOTE — Progress Notes (Signed)
HPI Mr. Noah Charon returns today for followup. He has a h/o peristent atrial fib and CHB, s/p PPM. He has done well. In the interim he has reverted back to NSR. He denies chest pain or sob. No edema.  he has been bothered by lumbar spine problems and is unable to exercise much.  No Known Allergies   Current Outpatient Medications  Medication Sig Dispense Refill   acetaminophen (TYLENOL) 500 MG tablet Take 1,000 mg by mouth every 6 (six) hours as needed for moderate pain or headache.     allopurinol (ZYLOPRIM) 300 MG tablet Take 300 mg by mouth daily.     carvedilol (COREG) 25 MG tablet Take 1.5 tablets (37.5 mg total) by mouth 2 (two) times daily with a meal. Please call to schedule an overdue appointment with Dr. Ladona Ridgel for refills, [phone], thank you. 2ND ATTEMPT 90 tablet 0   clotrimazole-betamethasone (LOTRISONE) cream Apply to affected area 2 times daily. (Patient not taking: Reported on 03/15/2021) 30 g 0   diphenhydrAMINE-APAP, sleep, (TYLENOL PM EXTRA STRENGTH) 50-1000 MG/30ML LIQD Take 15 mLs by mouth at bedtime as needed (sleep).     dorzolamide-timolol (COSOPT) 22.3-6.8 MG/ML ophthalmic solution Place 1 drop into both eyes 2 (two) times daily.     ELIQUIS 5 MG TABS tablet TAKE 1 TABLET(5 MG) BY MOUTH TWICE DAILY 180 tablet 1   gabapentin (NEURONTIN) 300 MG capsule Take 300 mg by mouth 2 (two) times daily. (Patient not taking: Reported on 03/15/2021)     hydrochlorothiazide (HYDRODIURIL) 25 MG tablet TAKE 1 TABLET(25 MG) BY MOUTH DAILY 90 tablet 0   HYDROcodone-acetaminophen (NORCO/VICODIN) 5-325 MG tablet Take 1 tablet by mouth every 6 (six) hours as needed. 16 tablet 0   JANUMET 50-1000 MG tablet Take 1 tablet by mouth 2 (two) times daily.     omeprazole (PRILOSEC) 40 MG capsule Take 1 capsule (40 mg total) by mouth daily. (Patient not taking: Reported on 03/15/2021) 30 capsule 3   predniSONE (DELTASONE) 50 MG tablet Take 1 tablet (50 mg total) by mouth daily. (Patient not  taking: Reported on 03/15/2021) 5 tablet 0   rosuvastatin (CRESTOR) 20 MG tablet Take by mouth. (Patient not taking: Reported on 03/15/2021)     No current facility-administered medications for this visit.     Past Medical History:  Diagnosis Date   Arthritis    Blockage of coronary artery of heart (HCC)    Complete heart block (HCC)    Depression    Gastritis    Hx of    Glaucoma    History of heart attack 1992   HTN (hypertension)    Pacemaker    Syncope    Unspecified essential hypertension     ROS:   All systems reviewed and negative except as noted in the HPI.   Past Surgical History:  Procedure Laterality Date   PACEMAKER INSERTION     PPM GENERATOR CHANGEOUT N/A 02/16/2019   Procedure: PPM GENERATOR CHANGEOUT;  Surgeon: Marinus Maw, MD;  Location: [facility];  Service: Cardiovascular;  Laterality: N/A;     Family History  Problem Relation Age of Onset   Diabetes Mother    Lung cancer Father    Colon cancer Neg Hx      Social History   Socioeconomic History   Marital status: Widowed    Spouse name: Not on file   Number of children: 1   Years of education: Not on file   Highest  education level: Not on file  Occupational History   Not on file  Tobacco Use   Smoking status: Every Day   Smokeless tobacco: Never  Substance and Sexual Activity   Alcohol use: Yes    Comment: Little usage   Drug use: No   Sexual activity: Yes    Birth control/protection: Condom  Other Topics Concern   Not on file  Social History Narrative   Not on file   Social Determinants of Health   Financial Resource Strain: Not on file  Food Insecurity: Not on file  Transportation Needs: Not on file  Physical Activity: Not on file  Stress: Not on file  Social Connections: Not on file  Intimate Partner Violence: Not on file     BP (!) 100/56   Pulse 95   Ht 5\' 8"  (1.727 m)   Wt 175 lb 9.6 oz (79.7 kg)   SpO2 97%   BMI 26.70 kg/m   Physical Exam:  Well  appearing NAD HEENT: Unremarkable Neck:  No JVD, no thyromegally Lymphatics:  No adenopathy Back:  No CVA tenderness Lungs:  Clear with no wheezes HEART:  Regular rate rhythm, no murmurs, no rubs, no clicks Abd:  soft, positive bowel sounds, no organomegally, no rebound, no guarding Ext:  2 plus pulses, no edema, no cyanosis, no clubbing Skin:  No rashes no nodules Neuro:  CN II through XII intact, motor grossly intact  EKG - nsr with atrial and ventricular pacing  DEVICE  Normal device function.  See PaceArt for details.   Assess/Plan:  1. CHB - he is asymptomatic, s/p PPM insertion. 2. PPM - his [employer] DDD PM remains programmed DDD 3. HTN - his bp is controlled. No change. 4. Persistent atrial fib - he appears to be back in NSR and hopefully he will continue his NSR.  Carleene Overlie Jamesyn [place],MD

## 2022-08-26 NOTE — Patient Instructions (Signed)

## 2022-09-02 NOTE — Progress Notes (Signed)
Remote pacemaker transmission.   

## 2022-10-06 ENCOUNTER — Other Ambulatory Visit: Payer: Self-pay | Admitting: Internal Medicine

## 2022-10-06 DIAGNOSIS — I480 Paroxysmal atrial fibrillation: Secondary | ICD-10-CM

## 2022-10-06 NOTE — Telephone Encounter (Signed)
Eliquis 66m refill request received. Patient is 82years old, weight-79.7kg, Crea-1.16 [facility]/20[facility] and last seen by Dr. TLovena Leon 08/26/22. Dose is appropriate based on dosing criteria. Will send in refill to requested pharmacy.

## 2022-11-13 ENCOUNTER — Ambulatory Visit (INDEPENDENT_AMBULATORY_CARE_PROVIDER_SITE_OTHER): Payer: Medicare HMO

## 2022-11-13 DIAGNOSIS — I442 Atrioventricular block, complete: Secondary | ICD-10-CM

## 2022-11-13 LAB — CUP PACEART REMOTE DEVICE CHECK
Battery Remaining Longevity: 52 mo
Battery Remaining Percentage: 61 %
Battery Voltage: 2.99 V
Brady Statistic AP VP Percent: 64 %
Brady Statistic AP VS Percent: 1 %
Brady Statistic AS VP Percent: 35 %
Brady Statistic AS VS Percent: 1 %
Brady Statistic RA Percent Paced: 49 %
Brady Statistic RV Percent Paced: 99 %
Date Time Interrogation Session: 20240328040014
Implantable Lead Connection Status: 753985
Implantable Lead Connection Status: 753985
Implantable Lead Implant Date: 20110310
Implantable Lead Implant Date: 20110310
Implantable Lead Location: 753859
Implantable Lead Location: 753860
Implantable Pulse Generator Implant Date: 20200701
Lead Channel Impedance Value: 290 Ohm
Lead Channel Impedance Value: 290 Ohm
Lead Channel Pacing Threshold Amplitude: 0.5 V
Lead Channel Pacing Threshold Amplitude: 1 V
Lead Channel Pacing Threshold Pulse Width: 0.5 ms
Lead Channel Pacing Threshold Pulse Width: 0.5 ms
Lead Channel Sensing Intrinsic Amplitude: 1 mV
Lead Channel Sensing Intrinsic Amplitude: 5 mV
Lead Channel Setting Pacing Amplitude: 2 V
Lead Channel Setting Pacing Amplitude: 2.5 V
Lead Channel Setting Pacing Pulse Width: 0.5 ms
Lead Channel Setting Sensing Sensitivity: 4 mV
Pulse Gen Model: 2272
Pulse Gen Serial Number: 3315946

## 2022-11-18 ENCOUNTER — Other Ambulatory Visit: Payer: Self-pay

## 2022-11-18 MED ORDER — CARVEDILOL 25 MG PO TABS: 37.5000 mg | ORAL_TABLET | Freq: Two times a day (BID) | ORAL | 2 refills | Status: DC

## 2022-12-22 NOTE — Progress Notes (Signed)
Remote pacemaker transmission.   

## 2023-02-12 ENCOUNTER — Ambulatory Visit (INDEPENDENT_AMBULATORY_CARE_PROVIDER_SITE_OTHER): Payer: Medicare HMO

## 2023-02-12 DIAGNOSIS — I442 Atrioventricular block, complete: Secondary | ICD-10-CM | POA: Diagnosis not present

## 2023-02-12 LAB — CUP PACEART REMOTE DEVICE CHECK
Battery Remaining Longevity: 53 mo
Battery Remaining Percentage: 58 %
Battery Voltage: 2.98 V
Brady Statistic AP VP Percent: 62 %
Brady Statistic AP VS Percent: 1 %
Brady Statistic AS VP Percent: 37 %
Brady Statistic AS VS Percent: 1 %
Brady Statistic RA Percent Paced: 26 %
Brady Statistic RV Percent Paced: 99 %
Date Time Interrogation Session: 20240627040015
Implantable Lead Connection Status: 753985
Implantable Lead Connection Status: 753985
Implantable Lead Implant Date: 20110310
Implantable Lead Implant Date: 20110310
Implantable Lead Location: 753859
Implantable Lead Location: 753860
Implantable Pulse Generator Implant Date: 20200701
Lead Channel Impedance Value: 310 Ohm
Lead Channel Impedance Value: 310 Ohm
Lead Channel Pacing Threshold Amplitude: 0.5 V
Lead Channel Pacing Threshold Amplitude: 1 V
Lead Channel Pacing Threshold Pulse Width: 0.5 ms
Lead Channel Pacing Threshold Pulse Width: 0.5 ms
Lead Channel Sensing Intrinsic Amplitude: 1.2 mV
Lead Channel Sensing Intrinsic Amplitude: 7.8 mV
Lead Channel Setting Pacing Amplitude: 2 V
Lead Channel Setting Pacing Amplitude: 2.5 V
Lead Channel Setting Pacing Pulse Width: 0.5 ms
Lead Channel Setting Sensing Sensitivity: 4 mV
Pulse Gen Model: 2272
Pulse Gen Serial Number: 3315946

## 2023-02-13 ENCOUNTER — Other Ambulatory Visit: Payer: Self-pay

## 2023-02-13 MED ORDER — HYDROCHLOROTHIAZIDE 25 MG PO TABS: ORAL_TABLET | ORAL | 1 refills | Status: DC

## 2023-02-27 NOTE — Progress Notes (Signed)
Remote pacemaker transmission.   

## 2023-04-08 ENCOUNTER — Other Ambulatory Visit: Payer: Self-pay

## 2023-04-08 DIAGNOSIS — I480 Paroxysmal atrial fibrillation: Secondary | ICD-10-CM

## 2023-04-08 MED ORDER — APIXABAN 5 MG PO TABS: 5.0000 mg | ORAL_TABLET | Freq: Two times a day (BID) | ORAL | 1 refills | Status: DC

## 2023-04-08 NOTE — Telephone Encounter (Signed)
Prescription refill request for Eliquis received. Indication:afib Last office visit:1/24 Scr:0.98  5/24 Age: 82 Weight:79.7  kg  Prescription refilled

## 2023-05-14 ENCOUNTER — Ambulatory Visit (INDEPENDENT_AMBULATORY_CARE_PROVIDER_SITE_OTHER): Payer: Medicare HMO

## 2023-05-14 DIAGNOSIS — I442 Atrioventricular block, complete: Secondary | ICD-10-CM

## 2023-05-14 LAB — CUP PACEART REMOTE DEVICE CHECK
Battery Remaining Longevity: 48 mo
Battery Remaining Percentage: 55 %
Battery Voltage: 2.99 V
Brady Statistic AP VP Percent: 61 %
Brady Statistic AP VS Percent: 1 %
Brady Statistic AS VP Percent: 38 %
Brady Statistic AS VS Percent: 1 %
Brady Statistic RA Percent Paced: 22 %
Brady Statistic RV Percent Paced: 99 %
Date Time Interrogation Session: 20240926040015
Implantable Lead Connection Status: 753985
Implantable Lead Connection Status: 753985
Implantable Lead Implant Date: 20110310
Implantable Lead Implant Date: 20110310
Implantable Lead Location: 753859
Implantable Lead Location: 753860
Implantable Pulse Generator Implant Date: 20200701
Lead Channel Impedance Value: 280 Ohm
Lead Channel Impedance Value: 280 Ohm
Lead Channel Pacing Threshold Amplitude: 0.5 V
Lead Channel Pacing Threshold Amplitude: 1 V
Lead Channel Pacing Threshold Pulse Width: 0.5 ms
Lead Channel Pacing Threshold Pulse Width: 0.5 ms
Lead Channel Sensing Intrinsic Amplitude: 0.8 mV
Lead Channel Sensing Intrinsic Amplitude: 6.4 mV
Lead Channel Setting Pacing Amplitude: 2 V
Lead Channel Setting Pacing Amplitude: 2.5 V
Lead Channel Setting Pacing Pulse Width: 0.5 ms
Lead Channel Setting Sensing Sensitivity: 4 mV
Pulse Gen Model: 2272
Pulse Gen Serial Number: 3315946

## 2023-05-25 NOTE — Progress Notes (Unsigned)
Cardiology Office Note:  .   Date:  05/25/2023  ID:  Joseph Austin, DOB [birth year], MRN [id number] PCP: Andreas Blower., MD  [facility] Providers Cardiologist:  Lewayne Bunting, MD Electrophysiologist:  Lewayne Bunting, MD {  History of Present Illness: [name] Kitchen   Joseph Austin is a 82 y.o. male w/PMHx of HTN, HLD,  CHB w/PPM, Afib  He saw Dr. Ladona Ridgel Jan 2024, struggling with back pain, had converted to SR since his last visit, described as persistent, no changes were made.  He saw his PMD 05/04/23, f/u on abnormal ABIs described as asymptomatic.  BP was a bit low and his hydrochlorothiazide dose reduced   Today's visit is scheduled as a visit to f/u on Afib per PMD ? Toay's visit is assisted by official Spanish translator "Tim"   ROS:   He reports doing well, "for an 82 y/o" No CP Infrequently aware of some flutter in his heart beat No symptoms of dizziness, near syncope or syncope. No SOB, DOE He reports goo compliance with his medicines, has held his [facility] for back injections intermittently, but only then  His back being his biggest limiter, suffers with the pain quite a bit, looking to get another injection soon he hopes.   Device information Abbott dual chamber PPM implanted 10/25/2009  Arrhythmia/AAD hx Dx goes back to 2019 at least  No AAD to date that I find  Studies Reviewed: [name] Kitchen    EKG not done today  DEVICE interrogation done today and reviewed by myself Battery and lead measurements are good He is rate controlled (paced) AFlutter  65% burden He has had noise on both leads, rare V lead noise only occurred simultaneously with A lead and suspect external perhaps A lead has occurred independently of the V lead Noise reversion occurs  I am unable to reproduce noise on the v lead today with any provocative maneuvers. A lead brief moment of noise only with hand rubbing only Lead measurements are stable No R waves today at 40 RV lead sensitivity  changed from 4.0 > 5.0    08/25/2018: stress myoview Nuclear stress EF: 61%. Blood pressure demonstrated a normal response to exercise. There was no ST segment deviation noted during stress. Findings consistent with prior myocardial infarction. This is a low risk study. The left ventricular ejection fraction is normal (55-65%).   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   Wt Readings from Last 3 Encounters:  08/26/22 175 lb 9.6 oz (79.7 kg)  03/15/21 185 lb 9.6 oz (84.2 kg)  02/23/20 198 lb (89.8 kg)    GEN: Well nourished, well developed in no acute distress NECK: No JVD; No carotid bruits CARDIAC: RRR (paced), no murmurs, rubs, gallops RESPIRATORY:  CTA b/l without rales, wheezing or rhonchi  ABDOMEN: Soft, non-tender, non-distended EXTREMITIES:  No edema; No deformity   PPM site:  is stable, no thinning, fluctuation, tethering  ASSESSMENT AND PLAN: .    persistent AFib CHA2DS2Vasc is 3, on eliquis, appropriately dosed by labs in may Rate controlled flutter today He is looking to get his back injected again soon, will need to interrupt his OAC and given no symptoms I did not try to pace terminate   PPM A lead noise intermittently reproducible not on the V lead Noted as above RV sensitivity adjusted as described Will have him back in a  couple months to revisit RV lead particularly Though suspect perhaps ? external  Secondary hypercoagulable state 2/2 AFib      Dispo: as above  Signed, Sheilah Pigeon, PA-C

## 2023-05-26 NOTE — Progress Notes (Signed)
Remote pacemaker transmission.   

## 2023-05-27 ENCOUNTER — Ambulatory Visit: Payer: Medicare HMO | Attending: Physician Assistant | Admitting: Physician Assistant

## 2023-05-27 ENCOUNTER — Encounter: Payer: Self-pay | Admitting: Physician Assistant

## 2023-05-27 VITALS — BP 110/66 | HR 68 | Ht 68.0 in | Wt 181.0 lb

## 2023-05-27 DIAGNOSIS — I4892 Unspecified atrial flutter: Secondary | ICD-10-CM

## 2023-05-27 DIAGNOSIS — I4819 Other persistent atrial fibrillation: Secondary | ICD-10-CM

## 2023-05-27 DIAGNOSIS — Z950 Presence of cardiac pacemaker: Secondary | ICD-10-CM | POA: Diagnosis not present

## 2023-05-27 DIAGNOSIS — D6869 Other thrombophilia: Secondary | ICD-10-CM

## 2023-05-27 LAB — CUP PACEART INCLINIC DEVICE CHECK
Battery Remaining Longevity: 52 mo
Battery Voltage: 2.99 V
Brady Statistic RA Percent Paced: 21 %
Brady Statistic RV Percent Paced: 99.53 %
Date Time Interrogation Session: 20241009173126
Implantable Lead Connection Status: 753985
Implantable Lead Connection Status: 753985
Implantable Lead Implant Date: 20110310
Implantable Lead Implant Date: 20110310
Implantable Lead Location: 753859
Implantable Lead Location: 753860
Implantable Pulse Generator Implant Date: 20200701
Lead Channel Impedance Value: 312.5 Ohm
Lead Channel Impedance Value: 312.5 Ohm
Lead Channel Pacing Threshold Amplitude: 0.75 V
Lead Channel Pacing Threshold Amplitude: 0.75 V
Lead Channel Pacing Threshold Pulse Width: 0.5 ms
Lead Channel Pacing Threshold Pulse Width: 0.5 ms
Lead Channel Sensing Intrinsic Amplitude: 1.3 mV
Lead Channel Sensing Intrinsic Amplitude: 6.4 mV
Lead Channel Setting Pacing Amplitude: 2 V
Lead Channel Setting Pacing Amplitude: 2.5 V
Lead Channel Setting Pacing Pulse Width: 0.5 ms
Lead Channel Setting Sensing Sensitivity: 5 mV
Pulse Gen Model: 2272
Pulse Gen Serial Number: 3315946

## 2023-05-27 NOTE — Patient Instructions (Addendum)
Medication Instructions:    Your physician recommends that you continue on your current medications as directed. Please refer to the Current Medication list given to you today.  *If you need a refill on your cardiac medications before your next appointment, please call your pharmacy*   Lab Work: NONE ORDERED  TODAY    If you have labs (blood work) drawn today and your tests are completely normal, you will receive your results only by: MyChart Message (if you have MyChart) OR A paper copy in the mail If you have any lab test that is abnormal or we need to change your treatment, we will call you to review the results.   Testing/Procedures: NONE ORDERED  TODAY       Follow-Up: At [facility], you and your health needs are our priority.  As part of our continuing mission to provide you with exceptional heart care, we have created designated Provider Care Teams.  These Care Teams include your primary Cardiologist (physician) and Advanced Practice Providers (APPs -  Physician Assistants and Nurse Practitioners) who all work together to provide you with the care you need, when you need it.  We recommend signing up for the patient portal called "MyChart".  Sign up information is provided on this After Visit Summary.  MyChart is used to connect with patients for Virtual Visits (Telemedicine).  Patients are able to view lab/test results, encounter notes, upcoming appointments, etc.  Non-urgent messages can be sent to your provider as well.   To learn more about what you can do with MyChart, go to [email or web address].    Your next appointment:   2 month(s)  Provider:   You may see Lewayne Bunting, MD or one of the following Advanced Practice Providers on your designated Care Team:   Francis Dowse, [place]  Other Instructions

## 2023-08-02 NOTE — Progress Notes (Unsigned)
  Cardiology Office Note:  .   Date:  08/02/2023  ID:  Joseph Austin, DOB 1941/08/02, MRN [id number] PCP: Joseph Austin., MD  [facility] Providers Cardiologist:  Joseph Bunting, MD Electrophysiologist:  Joseph Bunting, MD {  History of Present Illness: Joseph Austin Kitchen   Joseph Austin is a 82 y.o. male w/PMHx of HTN, HLD,  CHB w/PPM, Afib  He saw Dr. Ladona Austin Jan 2024, struggling with back pain, had converted to SR since his last visit, described as persistent, no changes were made.  He saw his PMD 05/04/23, f/u on abnormal ABIs described as asymptomatic.  BP was a bit low and his hydrochlorothiazide dose reduced   I saw him 05/27/23 Today's visit is assisted by official Spanish translator "Joseph Austin" He reports doing well, "for an 82 y/o" No CP Infrequently aware of some flutter in his heart beat No symptoms of dizziness, near syncope or syncope. No SOB, DOE He reports goo compliance with his medicines, has held his [facility] for back injections intermittently, but only then His back being his biggest limiter, suffers with the pain quite a bit, looking to get another injection soon he hopes. Was in AFlutter (65% burden) Noted rare noise (both leads) unable to reproduce V lead noise only occurred simultaneously with A lead and suspect external perhaps A lead has occurred independently of the V lead Noise reversion occurs + A lead noise briefly with hand rubbing RV lead sensitivity adjusted up to 5.0 > dependent   TODAY's visit is a planned 2 mo f/u  ROS:   *** noise? *** eliquis, dose, bleeding, labs interrupted for back injection? When? Compliance? *** pace terminate? DCCV?   Device information Abbott dual chamber PPM implanted 10/25/2009  Arrhythmia/AAD hx Dx goes back to 2019 at least  No AAD to date that I find  Studies Reviewed: Joseph Austin Kitchen    EKG not done today  DEVICE interrogation done today and reviewed by myself *** Battery and lead measurements are  good ***   08/25/2018: stress myoview Nuclear stress EF: 61%. Blood pressure demonstrated a normal response to exercise. There was no ST segment deviation noted during stress. Findings consistent with prior myocardial infarction. This is a low risk study. The left ventricular ejection fraction is normal (55-65%).   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   Wt Readings from Last 3 Encounters:  05/27/23 181 lb (82.1 kg)  08/26/22 175 lb 9.6 oz (79.7 kg)  03/15/21 185 lb 9.6 oz (84.2 kg)    GEN: Well nourished, well developed in no acute distress NECK: No JVD; No carotid bruits CARDIAC: *** RRR (paced), no murmurs, rubs, gallops RESPIRATORY:  *** CTA b/l without rales, wheezing or rhonchi  ABDOMEN: Soft, non-tender, non-distended EXTREMITIES:  *** No edema; No deformity   *** PPM site:  is stable, no thinning, fluctuation, tethering  ASSESSMENT AND PLAN: .    persistent AFib CHA2DS2Vasc is 3, on eliquis, *** appropriately dosed  ***   PPM A lead noise intermittently reproducible not on the V lead ***  Secondary hypercoagulable state 2/2 AFib      Dispo: as above  Signed, Joseph Pigeon, PA-C

## 2023-08-04 ENCOUNTER — Encounter: Payer: Self-pay | Admitting: Physician Assistant

## 2023-08-04 ENCOUNTER — Ambulatory Visit: Payer: Medicare HMO | Attending: Physician Assistant | Admitting: Physician Assistant

## 2023-08-04 VITALS — BP 112/66 | HR 60 | Ht 68.0 in | Wt 181.8 lb

## 2023-08-04 DIAGNOSIS — D6869 Other thrombophilia: Secondary | ICD-10-CM

## 2023-08-04 DIAGNOSIS — I4811 Longstanding persistent atrial fibrillation: Secondary | ICD-10-CM | POA: Diagnosis not present

## 2023-08-04 DIAGNOSIS — I4892 Unspecified atrial flutter: Secondary | ICD-10-CM | POA: Diagnosis not present

## 2023-08-04 DIAGNOSIS — Z950 Presence of cardiac pacemaker: Secondary | ICD-10-CM | POA: Diagnosis not present

## 2023-08-04 LAB — CUP PACEART INCLINIC DEVICE CHECK
Battery Remaining Longevity: 52 mo
Battery Voltage: 2.98 V
Brady Statistic RA Percent Paced: 0 %
Brady Statistic RV Percent Paced: 99.82 %
Date Time Interrogation Session: 20241217173854
Implantable Lead Connection Status: 753985
Implantable Lead Connection Status: 753985
Implantable Lead Implant Date: 20110310
Implantable Lead Implant Date: 20110310
Implantable Lead Location: 753859
Implantable Lead Location: 753860
Implantable Pulse Generator Implant Date: 20200701
Lead Channel Impedance Value: 287.5 Ohm
Lead Channel Impedance Value: 312.5 Ohm
Lead Channel Pacing Threshold Amplitude: 0.75 V
Lead Channel Pacing Threshold Amplitude: 0.75 V
Lead Channel Pacing Threshold Pulse Width: 0.5 ms
Lead Channel Pacing Threshold Pulse Width: 0.5 ms
Lead Channel Sensing Intrinsic Amplitude: 1.6 mV
Lead Channel Sensing Intrinsic Amplitude: 6.8 mV
Lead Channel Setting Pacing Amplitude: 2 V
Lead Channel Setting Pacing Amplitude: 2.5 V
Lead Channel Setting Pacing Pulse Width: 0.5 ms
Lead Channel Setting Sensing Sensitivity: 5 mV
Pulse Gen Model: 2272
Pulse Gen Serial Number: 3315946

## 2023-08-04 NOTE — Patient Instructions (Signed)
Medication Instructions:   Your physician recommends that you continue on your current medications as directed. Please refer to the Current Medication list given to you today.    *If you need a refill on your cardiac medications before your next appointment, please call your pharmacy*   Lab Work: NONE ORDERED  TODAY   If you have labs (blood work) drawn today and your tests are completely normal, you will receive your results only by: MyChart Message (if you have MyChart) OR A paper copy in the mail If you have any lab test that is abnormal or we need to change your treatment, we will call you to review the results.   Testing/Procedures: NONE ORDERED  TODAY     Follow-Up: At [facility], you and your health needs are our priority.  As part of our continuing mission to provide you with exceptional heart care, we have created designated Provider Care Teams.  These Care Teams include your primary Cardiologist (physician) and Advanced Practice Providers (APPs -  Physician Assistants and Nurse Practitioners) who all work together to provide you with the care you need, when you need it.  We recommend signing up for the patient portal called "MyChart".  Sign up information is provided on this After Visit Summary.  MyChart is used to connect with patients for Virtual Visits (Telemedicine).  Patients are able to view lab/test results, encounter notes, upcoming appointments, etc.  Non-urgent messages can be sent to your provider as well.   To learn more about what you can do with MyChart, go to [email or web address].    Your next appointment:   6 month(s)  Provider:   Lewayne Bunting, MD    Other Instructions

## 2023-08-10 ENCOUNTER — Other Ambulatory Visit: Payer: Self-pay | Admitting: Internal Medicine

## 2023-08-13 ENCOUNTER — Ambulatory Visit (INDEPENDENT_AMBULATORY_CARE_PROVIDER_SITE_OTHER): Payer: Medicare Other

## 2023-08-13 DIAGNOSIS — I442 Atrioventricular block, complete: Secondary | ICD-10-CM | POA: Diagnosis not present

## 2023-08-14 LAB — CUP PACEART REMOTE DEVICE CHECK
Battery Remaining Longevity: 51 mo
Battery Remaining Percentage: 52 %
Battery Voltage: 2.98 V
Brady Statistic AP VP Percent: 35 %
Brady Statistic AP VS Percent: 0 %
Brady Statistic AS VP Percent: 65 %
Brady Statistic AS VS Percent: 0 %
Brady Statistic RA Percent Paced: 1 %
Brady Statistic RV Percent Paced: 99 %
Date Time Interrogation Session: 20241226040015
Implantable Lead Connection Status: 753985
Implantable Lead Connection Status: 753985
Implantable Lead Implant Date: 20110310
Implantable Lead Implant Date: 20110310
Implantable Lead Location: 753859
Implantable Lead Location: 753860
Implantable Pulse Generator Implant Date: 20200701
Lead Channel Impedance Value: 310 Ohm
Lead Channel Impedance Value: 310 Ohm
Lead Channel Pacing Threshold Amplitude: 0.5 V
Lead Channel Pacing Threshold Amplitude: 0.75 V
Lead Channel Pacing Threshold Pulse Width: 0.5 ms
Lead Channel Pacing Threshold Pulse Width: 0.5 ms
Lead Channel Sensing Intrinsic Amplitude: 1.6 mV
Lead Channel Sensing Intrinsic Amplitude: 6.8 mV
Lead Channel Setting Pacing Amplitude: 2 V
Lead Channel Setting Pacing Amplitude: 2.5 V
Lead Channel Setting Pacing Pulse Width: 0.5 ms
Lead Channel Setting Sensing Sensitivity: 5 mV
Pulse Gen Model: 2272
Pulse Gen Serial Number: 3315946

## 2023-08-17 ENCOUNTER — Telehealth: Payer: Self-pay | Admitting: Internal Medicine

## 2023-08-17 DIAGNOSIS — I480 Paroxysmal atrial fibrillation: Secondary | ICD-10-CM

## 2023-08-17 MED ORDER — APIXABAN 5 MG PO TABS: 5.0000 mg | ORAL_TABLET | Freq: Two times a day (BID) | ORAL | 1 refills | Status: DC

## 2023-08-17 NOTE — Telephone Encounter (Signed)
*  STAT* If patient is at the pharmacy, call can be transferred to refill team.   1. Which medications need to be refilled? (please list name of each medication and dose if known)   apixaban (ELIQUIS) 5 MG TABS tablet   2. Would you like to learn more about the convenience, safety, & potential cost savings by using the [facility]?   3. Are you open to using the [facility] (Type [facility]. ).  4. Which pharmacy/location (including street and city if local pharmacy) is medication to be sent to?  [id number] [place],  - [address]   5. Do they need a 30 day or 90 day supply?   90 day  [facility]) stated patient completely out.

## 2023-08-17 NOTE — Telephone Encounter (Signed)
Pt last saw Francis Dowse, [place] on 08/04/23, last labs 07/27/23 Creat 0.97, age 82, weight 82.5kg, based on specified criteria pt is on appropriate dosage of Eliquis 5mg  BID for aflutter.  Will refill rx.

## 2023-09-08 ENCOUNTER — Telehealth: Payer: Self-pay | Admitting: Internal Medicine

## 2023-09-08 NOTE — Telephone Encounter (Signed)
   Pre-operative Risk Assessment    Patient Name: Joseph Austin  DOB: [birth year] MRN: [id number]   Date of last office visit: 08/04/2023  Date of next office visit: none   Request for Surgical Clearance    Procedure:   Transforaminal epidural steroid injection  Date of Surgery:  Clearance 10/08/23                                Surgeon:  Dr. Rolene Course Surgeon's Group or Practice Name:  [facility] Phone number:  [phone] [facility] Fax number:  [phone]   Type of Clearance Requested:   - Pharmacy:  Hold Apixaban (Eliquis) Hold 3 days prior to procedure .  Will not have to hold medication for the following 4 visits after procedure.   Type of Anesthesia:  None    Additional requests/questions:    Signed, Royann Shivers   09/08/2023, 9:25 AM

## 2023-09-08 NOTE — Telephone Encounter (Signed)
Patient with diagnosis of A Fib on Eliquis for anticoagulation.    Procedure: Transforaminal epidural steroid injection  Date of procedure: 10/08/23   CHA2DS2-VASc Score = 4  This indicates a 4.8% annual risk of stroke. The patient's score is based upon: CHF History: 0 HTN History: 1 Diabetes History: 1 Stroke History: 0 Vascular Disease History: 0 Age Score: 2 Gender Score: 0   CrCl 71 ml/min Platelet count 164K  Per office protocol, patient can hold Eliquis for 3 days prior to procedure.     **This guidance is not considered finalized until pre-operative APP has relayed final recommendations.**

## 2023-09-08 NOTE — Telephone Encounter (Signed)
   Patient Name: Joseph Austin  DOB: 01-30-1941 MRN: [id number]  Primary Cardiologist: Lewayne Bunting, MD  Clinical pharmacists have reviewed the patient's past medical history, labs, and current medications as part of preoperative protocol coverage. The following recommendations have been made:  Per office protocol, patient can hold Eliquis for 3 days prior to procedure   I will route this recommendation to the requesting party via Epic fax function and remove from pre-op pool.  Please call with questions.  Denyce Robert, NP 09/08/2023, 3:27 PM

## 2023-09-25 ENCOUNTER — Other Ambulatory Visit: Payer: Self-pay | Admitting: Internal Medicine

## 2023-10-16 ENCOUNTER — Emergency Department (HOSPITAL_COMMUNITY): Payer: Medicare HMO

## 2023-10-16 ENCOUNTER — Other Ambulatory Visit: Payer: Self-pay

## 2023-10-16 ENCOUNTER — Emergency Department (HOSPITAL_COMMUNITY)
Admission: EM | Admit: 2023-10-16 | Discharge: 2023-10-16 | Disposition: A | Discharge status: Home / Self Care | Payer: Medicare HMO

## 2023-10-16 ENCOUNTER — Encounter (HOSPITAL_COMMUNITY): Payer: Self-pay | Admitting: Emergency Medicine

## 2023-10-16 DIAGNOSIS — E119 Type 2 diabetes mellitus without complications: Secondary | ICD-10-CM | POA: Diagnosis not present

## 2023-10-16 DIAGNOSIS — Z79899 Other long term (current) drug therapy: Secondary | ICD-10-CM | POA: Diagnosis not present

## 2023-10-16 DIAGNOSIS — R030 Elevated blood-pressure reading, without diagnosis of hypertension: Secondary | ICD-10-CM | POA: Diagnosis present

## 2023-10-16 DIAGNOSIS — R519 Headache, unspecified: Secondary | ICD-10-CM | POA: Diagnosis not present

## 2023-10-16 DIAGNOSIS — I10 Essential (primary) hypertension: Secondary | ICD-10-CM | POA: Diagnosis not present

## 2023-10-16 DIAGNOSIS — Z7901 Long term (current) use of anticoagulants: Secondary | ICD-10-CM | POA: Insufficient documentation

## 2023-10-16 DIAGNOSIS — R9389 Abnormal findings on diagnostic imaging of other specified body structures: Secondary | ICD-10-CM

## 2023-10-16 DIAGNOSIS — I160 Hypertensive urgency: Secondary | ICD-10-CM

## 2023-10-16 LAB — CBC WITH DIFFERENTIAL/PLATELET
Abs Immature Granulocytes: 0.07 10*3/uL (ref 0.00–0.07)
Basophils Absolute: 0 10*3/uL (ref 0.0–0.1)
Basophils Relative: 0 %
Eosinophils Absolute: 0.4 10*3/uL (ref 0.0–0.5)
Eosinophils Relative: 4 %
HCT: 35 % — ABNORMAL LOW (ref 39.0–52.0)
Hemoglobin: 11.8 g/dL — ABNORMAL LOW (ref 13.0–17.0)
Immature Granulocytes: 1 %
Lymphocytes Relative: 15 %
Lymphs Abs: 1.7 10*3/uL (ref 0.7–4.0)
MCH: 33 pg (ref 26.0–34.0)
MCHC: 33.7 g/dL (ref 30.0–36.0)
MCV: 97.8 fL (ref 80.0–100.0)
Monocytes Absolute: 1.1 10*3/uL — ABNORMAL HIGH (ref 0.1–1.0)
Monocytes Relative: 10 %
Neutro Abs: 7.8 10*3/uL — ABNORMAL HIGH (ref 1.7–7.7)
Neutrophils Relative %: 70 %
Platelets: 189 10*3/uL (ref 150–400)
RBC: 3.58 MIL/uL — ABNORMAL LOW (ref 4.22–5.81)
RDW: 15 % (ref 11.5–15.5)
WBC: 11.2 10*3/uL — ABNORMAL HIGH (ref 4.0–10.5)
nRBC: 0 % (ref 0.0–0.2)

## 2023-10-16 LAB — COMPREHENSIVE METABOLIC PANEL
ALT: 36 U/L (ref 0–44)
AST: 36 U/L (ref 15–41)
Albumin: 3.8 g/dL (ref 3.5–5.0)
Alkaline Phosphatase: 45 U/L (ref 38–126)
Anion gap: 15 (ref 5–15)
BUN: 35 mg/dL — ABNORMAL HIGH (ref 8–23)
CO2: 18 mmol/L — ABNORMAL LOW (ref 22–32)
Calcium: 9.6 mg/dL (ref 8.9–10.3)
Chloride: 103 mmol/L (ref 98–111)
Creatinine, Ser: 0.9 mg/dL (ref 0.61–1.24)
GFR, Estimated: >60 mL/min (ref >60)
Glucose, Bld: 127 mg/dL — ABNORMAL HIGH (ref 70–99)
Potassium: 5 mmol/L (ref 3.5–5.1)
Sodium: 136 mmol/L (ref 135–145)
Total Bilirubin: 0.6 mg/dL (ref 0.0–1.2)
Total Protein: 7.2 g/dL (ref 6.5–8.1)

## 2023-10-16 MED ORDER — IOHEXOL 350 MG/ML SOLN
75.0000 mL | Freq: Once | INTRAVENOUS | Status: AC | PRN
  Administered 2023-10-16: 75 mL via INTRAVENOUS

## 2023-10-16 NOTE — ED Triage Notes (Signed)
 Pt in from home via [facility] with HTN. Pt is Spanish-speaking, and when EMS arrived pt was checking his BP and it was greater than 200 systolic. Due to language barrier, other information was limited. Arrives with BP 151/75

## 2023-10-16 NOTE — ED Provider Triage Note (Signed)
 Emergency Medicine Provider Triage Evaluation Note  Joseph Austin , a 83 y.o. male  was evaluated in triage.  Pt complains of elevated BP.  Denies chest pain or SOB.  Had headache earlier, but this has resolved.  Took his morning BP meds.  Improved now.    Review of Systems  Positive: Elevated BP Negative:   Physical Exam  BP (!) 151/75   Pulse 60   Temp 98 F (36.7 C) (Oral)   Resp 18   Wt 82.5 kg   SpO2 98%   BMI 27.65 kg/m  Gen:   Awake, no distress   Resp:  Normal effort  MSK:   Moves extremities without difficulty  Other:    Medical Decision Making  Medically screening exam initiated at 5:31 AM.  Appropriate orders placed.  Joseph Austin was informed that the remainder of the evaluation will be completed by another provider, this initial triage assessment does not replace that evaluation, and the importance of remaining in the ED until their evaluation is complete.     Roxy Horseman, PA-C 10/16/23 [phone]

## 2023-10-16 NOTE — ED Provider Notes (Signed)
 Care of patient received from prior provider at 3:22 PM, please see their note for complete H/P and care plan.  Received handoff per ED course.  Clinical Course as of 10/16/23 1522  Fri Oct 16, 2023  1519 StableHO ART Headache. 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 Can't get MRI 2/2 hardware. CTA pending. If no blockage then OP management. [CC]    Clinical Course User Index [CC] Glyn Ade, MD    Reassessment: Blood pressure spontaneously improved.  CTA resulted with no acute pathology.  Based on plan at handoff, if CT negative patient stable for outpatient care management.  Patient is completely asymptomatic is already called his PCP to arrange follow-up.  Disposition:  I have considered need for hospitalization, however, considering all of the above, I believe this patient is stable for discharge at this time.  Patient/family educated about specific return precautions for given chief complaint and symptoms.  Patient/family educated about follow-up with PCP.     Patient/family expressed understanding of return precautions and need for follow-up. Patient spoken to regarding all imaging and laboratory results and appropriate follow up for these results. All education provided in verbal form with additional information in written form. Time was allowed for answering of patient questions. Patient discharged.    Emergency Department Medication Summary:   Medications  iohexol (OMNIPAQUE) 350 MG/ML injection 75 mL (75 mLs Intravenous Contrast Given 10/16/23 1643)            Glyn Ade, MD 10/16/23 2304

## 2023-10-16 NOTE — ED Notes (Signed)
 Pt ambulatory to and from restroom with steady gait, using cane

## 2023-10-16 NOTE — Plan of Care (Signed)
  These are curbside recommendations based upon the information readily available in the chart on brief review as well as history and examination information provided to me by requesting provider and do not replace a full detailed consult  Current vital signs: BP (!) 149/82   Pulse 67   Temp 98.1 F (36.7 C)   Resp 18   Ht 5\' 8"  (1.727 m)   Wt 82.5 kg   SpO2 99%   BMI 27.65 kg/m  Vital signs in last 24 hours: Temp:  [98 F (36.7 C)-98.1 F (36.7 C)] 98.1 F (36.7 C) (02/28 1200) Pulse Rate:  [60-67] 67 (02/28 1200) Resp:  [18] 18 (02/28 1200) BP: (146-151)/(75-88) 149/82 (02/28 1200) SpO2:  [98 %-100 %] 99 % (02/28 1200) Weight:  [82.5 kg] 82.5 kg (02/28 0819)   Basic Metabolic Panel: Recent Labs  Lab 10/16/23 0604  NA 136  K 5.0  CL 103  CO2 18*  GLUCOSE 127*  BUN 35*  CREATININE 0.90  CALCIUM 9.6    CBC: Recent Labs  Lab 10/16/23 0604  WBC 11.2*  NEUTROABS 7.8*  HGB 11.8*  HCT 35.0*  MCV 97.8  PLT 189    Coagulation Studies: No results for input(s): "LABPROT", "INR" in the last 72 hours.    Presenting with headache in the setting of hypertensive urgency/emergency.  Head CT with an age-indeterminate left basal ganglia small hypodensity, personally reviewed and agree with radiology report No focal symptoms nor any focal findings on examination  If he is on anticoagulation he is already medically optimized from a stroke risk perspective and patient reports strict adherence to his Eliquis.  So then the only other thing to check is a CTA head and neck to make sure there is no critical stenosis that needs urgent follow-up in patient.  If he has not had any symptoms, particularly any right-sided weakness or aphasia etc. then I would suspect that stroke is older.  Also it is small enough that the risk of anticoagulation would be outweighed by benefit.   If CTA is negative, would not need further inpatient stroke/TIA workup at this time  8 minutes spent in care  of this patient majority in direct discussion with Dr. Rhae Hammock via secure chat and phone

## 2023-10-16 NOTE — ED Provider Notes (Signed)
 [facility] Provider Note   CSN: [id number] Arrival date & time: 10/16/23  4782     History  Chief Complaint  Patient presents with   Hypertension    Joseph Austin is a 83 y.o. male.  83 year old male with past medical history of hypertension and diabetes presents emergency department today with concern for elevated blood pressure.  The patient states he checked his blood pressure at home and is only 190 systolic.  This concerned him so he came to the ER.  He states he was having occipital headache at the time.  Reports this has resolved.  He denies any focal weakness, numbness, or tingling.  Denies any chest pain or shortness of breath.  He states that he has been taking his medications as prescribed.  He does follow-up with cardiology as an outpatient.  History taken using Spanish interpreter services   Hypertension Associated symptoms include headaches.       Home Medications Prior to Admission medications   Medication Sig Start Date End Date Taking? Authorizing Provider  acetaminophen (TYLENOL) 325 MG tablet Take 325 mg by mouth every 6 (six) hours as needed for moderate pain (pain score 4-6) or headache.   Yes [provider]  allopurinol (ZYLOPRIM) 300 MG tablet Take 300 mg by mouth daily.   Yes [provider]  apixaban (ELIQUIS) 5 MG TABS tablet Take 1 tablet (5 mg total) by mouth 2 (two) times daily. 08/17/23  Yes Marinus Maw, MD  carvedilol (COREG) 25 MG tablet Take 1.5 tablets (37.5 mg total) by mouth 2 (two) times daily with a meal. 09/25/23  Yes Marinus Maw, MD  dorzolamide-timolol (COSOPT) 22.3-6.8 MG/ML ophthalmic solution Place 1 drop into both eyes 2 (two) times daily.   Yes [provider]  hydrochlorothiazide (HYDRODIURIL) 12.5 MG tablet Take 12.5 mg by mouth daily.   Yes [provider]  JANUMET 50-1000 MG tablet Take 1 tablet by mouth 2 (two) times daily. 02/15/21  Yes  [provider]  rosuvastatin (CRESTOR) 20 MG tablet Take 20 mg by mouth daily. 10/05/19  Yes [provider]  hydrochlorothiazide (HYDRODIURIL) 25 MG tablet TAKE 1 TABLET(25 MG) BY MOUTH DAILY Patient not taking: Reported on 10/16/2023 08/10/23   Marinus Maw, MD      Allergies    Patient has no known allergies.    Review of Systems   Review of Systems  Neurological:  Positive for headaches. Negative for weakness and numbness.  All other systems reviewed and are negative.   Physical Exam Updated Vital Signs BP (!) 149/82   Pulse 67   Temp 98.1 F (36.7 C)   Resp 18   Ht 5\' 8"  (1.727 m)   Wt 82.5 kg   SpO2 99%   BMI 27.65 kg/m  Physical Exam Vitals and nursing note reviewed.   Gen: NAD Eyes: PERRL, EOMI HEENT: no oropharyngeal swelling Neck: trachea midline Resp: clear to auscultation bilaterally Card: RRR, no murmurs, rubs, or gallops Abd: nontender, nondistended Extremities: no calf tenderness, no edema Vascular: 2+ radial pulses bilaterally, 2+ DP pulses bilaterally Neuro: Cranial nerves intact, equal strength and sensation throughout bilateral upper and lower extremities Skin: no rashes Psyc: acting appropriately   ED Results / Procedures / Treatments   Labs (all labs ordered are listed, but only abnormal results are displayed) Labs Reviewed  CBC WITH DIFFERENTIAL/PLATELET - Abnormal; Notable for the following components:      Result Value  WBC 11.2 (*)    RBC 3.58 (*)    Hemoglobin 11.8 (*)    HCT 35.0 (*)    Neutro Abs 7.8 (*)    Monocytes Absolute 1.1 (*)    All other components within normal limits  COMPREHENSIVE METABOLIC PANEL - Abnormal; Notable for the following components:   CO2 18 (*)    Glucose, Bld 127 (*)    BUN 35 (*)    All other components within normal limits    EKG EKG Interpretation Date/Time:  Friday October 16 2023 05:59:10 EST Ventricular Rate:  60 PR Interval:    QRS Duration:  166 QT  Interval:  512 QTC Calculation: 512 R Axis:   270  Text Interpretation: Ventricular-paced rhythm Abnormal ECG Confirmed by Zadie Rhine ([id number]) on 10/16/2023 6:02:49 AM  Radiology CT Head Wo Contrast Result Date: 10/16/2023 CLINICAL DATA:  Headache with increasing frequency or severity. EXAM: CT HEAD WITHOUT CONTRAST TECHNIQUE: Contiguous axial images were obtained from the base of the skull through the vertex without intravenous contrast. RADIATION DOSE REDUCTION: This exam was performed according to the departmental dose-optimization program which includes automated exposure control, adjustment of the mA and/or kV according to patient size and/or use of iterative reconstruction technique. COMPARISON:  06/25/2011 FINDINGS: Brain: Vague band of low-density at the left basal ganglia and corona radiata. Chronic small vessel disease in the cerebral white matter. No evidence of acute infarct, hemorrhage, hydrocephalus, or collection. Mild cerebral volume loss for age Vascular: No hyperdense vessel or unexpected calcification. Skull: Band of high-density in the left posterior scalp, scarring when compared to 2012. 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 Electronically Signed   By: Tiburcio Pea M.D.   On: 10/16/2023 10:32    Procedures Procedures    Medications Ordered in ED Medications - No data to display  ED Course/ Medical Decision Making/ A&P                                 Medical Decision Making 83 year old male with past medical history of diabetes and hypertension presenting to the emergency department today with headache and elevated blood pressure.  The patient's blood pressure is improved here without any intervention.  He had labs drawn at triage and his creatinine appears to be at his baseline.  I will further evaluate him here with a CT scan of his head about for intracranial hemorrhage  given his headache and being on the Eliquis.  I think that if this is unremarkable that he may be safely discharged to follow-up with outpatient providers.  He does not have any symptoms on exam consistent with endorgan dysfunction at this time.  Patient initial labs are reassuring.  CT scan does show a stroke of undetermined age.  MRI is ordered.  Patient is unable to have MRI due to him having an older pacemaker.  I did discuss case with Dr. Iver Nestle.  She recommends CT angiogram of the head and neck for further evaluation for acute lesions that would change workup but at this time the patient is denying any new neurological symptoms other than headache and his exam is reassuring.  Plan will be for discharge if he remains well-appearing with no new neurological deficits and CT scan is negative.  CT angiogram pending at time of signout.  Amount and/or Complexity of Data Reviewed Radiology: ordered.  Final Clinical Impression(s) / ED Diagnoses Final diagnoses:  Elevated blood pressure reading    Rx / DC Orders ED Discharge Orders     None         Durwin Glaze, MD 10/16/23 [phone]

## 2023-10-17 ENCOUNTER — Other Ambulatory Visit: Payer: Self-pay

## 2023-10-17 ENCOUNTER — Ambulatory Visit (HOSPITAL_COMMUNITY)
Admission: EM | Admit: 2023-10-17 | Discharge: 2023-10-17 | Disposition: A | Discharge status: Home / Self Care | Attending: Emergency Medicine | Admitting: Emergency Medicine

## 2023-10-17 ENCOUNTER — Encounter (HOSPITAL_COMMUNITY): Payer: Self-pay | Admitting: *Deleted

## 2023-10-17 DIAGNOSIS — R591 Generalized enlarged lymph nodes: Secondary | ICD-10-CM | POA: Diagnosis not present

## 2023-10-17 LAB — POCT RAPID STREP A (OFFICE): Rapid Strep A Screen: NEGATIVE

## 2023-10-17 NOTE — ED Triage Notes (Signed)
 PT reports He had a CT yesterday at the ED. Pt woke up this morning with swelling to tonsillis

## 2023-10-17 NOTE — ED Provider Notes (Signed)
 [facility]    CSN: [id number] Arrival date & time: 10/17/23  1539     History   Chief Complaint Chief Complaint  Patient presents with   tonsillis swollen    HPI Joseph Austin is a 83 y.o. male.  Medical interpretor used for encounter Concerns for swelling of lymph nodes that started this morning. Feels swollen only on the outside. Denies any pain of neck or throat. No trouble swallowing, no difficulty breathing. States this has happened before "decades ago".   Was seen in the ED yesterday for headache and HTN. Had CTA of head and neck. Per imaging there is no abnormality of the neck including salivary glands, thyroid, lymph nodes, vertebral arteries, and tonsils.  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 04/29/2018   Spinal stenosis, 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   Hyperlipidemia LDL goal <100 04/13/2018   Type 2 diabetes mellitus without complication, without long-term current use of insulin (HCC) 04/13/2018   PAF (paroxysmal atrial fibrillation) (HCC) 03/09/2018   Prediabetes 05/18/2017   Tobacco dependence 05/18/2017   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   Other specified glaucoma 03/16/2017   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   Low back pain 09/25/2016   Chronic right-sided low back pain without sciatica 09/25/2016   Gastritis and  gastroduodenitis 11/17/2011   Gastritis and gastroduodenitis 11/17/2011   Cardiac pacemaker in situ 02/12/2010   Essential hypertension 10/31/2008   AV block, complete (HCC) 10/31/2008    Past Surgical History:  Procedure Laterality Date   PACEMAKER INSERTION     PPM GENERATOR CHANGEOUT N/A 02/16/2019   Procedure: PPM GENERATOR CHANGEOUT;  Surgeon: Marinus Maw, MD;  Location: [facility];  Service: Cardiovascular;  Laterality: N/A;       Home Medications    Prior to Admission medications   Medication Sig Start Date End Date Taking? Authorizing Provider  acetaminophen (TYLENOL) 325 MG tablet Take 325 mg by mouth every 6 (six) hours as needed for moderate pain (pain score 4-6) or headache.    [provider]  allopurinol (ZYLOPRIM) 300 MG tablet Take 300 mg by mouth daily.    [provider]  apixaban (ELIQUIS) 5 MG TABS tablet Take 1 tablet (5 mg total) by mouth 2 (two) times daily. 08/17/23   Marinus Maw, MD  carvedilol (COREG) 25 MG tablet Take 1.5 tablets (37.5 mg total) by mouth 2 (two) times daily with a meal. 09/25/23   Marinus Maw, MD  dorzolamide-timolol (COSOPT) 22.3-6.8 MG/ML ophthalmic solution Place 1 drop into both eyes 2 (two) times daily.    [provider]  hydrochlorothiazide (HYDRODIURIL) 12.5 MG tablet Take 12.5 mg by mouth daily.    [provider]  hydrochlorothiazide (HYDRODIURIL) 25 MG tablet TAKE 1 TABLET(25 MG) BY MOUTH  DAILY Patient not taking: Reported on 10/16/2023 08/10/23   Marinus Maw, MD  JANUMET 50-1000 MG tablet Take 1 tablet by mouth 2 (two) times daily. 02/15/21   [provider]  rosuvastatin (CRESTOR) 20 MG tablet Take 20 mg by mouth daily. 10/05/19   [provider]    Family History Family History  Problem Relation Age of Onset   Diabetes Mother    Lung cancer Father    Colon cancer Neg Hx     Social History Social History   Tobacco Use   Smoking status: Every Day    Smokeless tobacco: Never  Substance Use Topics   Alcohol use: Yes    Comment: Little usage   Drug use: No     Allergies   Patient has no known allergies.   Review of Systems Review of Systems Per HPI  Physical Exam Triage Vital Signs ED Triage Vitals  Encounter Vitals Group     BP      Systolic BP Percentile      Diastolic BP Percentile      Pulse      Resp      Temp      Temp src      SpO2      Weight      Height      Head Circumference      Peak Flow      Pain Score      Pain Loc      Pain Education      Exclude from Growth Chart    No data found.  Updated Vital Signs BP 128/78   Pulse 60   Temp (!) 97.5 F (36.4 C)   Resp 18   SpO2 95%    Physical Exam Vitals and nursing note reviewed.  Constitutional:      General: He is not in acute distress.    Appearance: Normal appearance.  HENT:     Mouth/Throat:     Mouth: Mucous membranes are moist.     Pharynx: Oropharynx is clear. Uvula midline. No pharyngeal swelling, oropharyngeal exudate, posterior oropharyngeal erythema or uvula swelling.     Tonsils: 0 on the right. 0 on the left.  Neck:     Trachea: Trachea and phonation normal.     Comments: No LAD is palpated by this provider. Full ROM of neck.  Cardiovascular:     Rate and Rhythm: Normal rate and regular rhythm.     Pulses: Normal pulses.     Heart sounds: Normal heart sounds.  Pulmonary:     Effort: Pulmonary effort is normal.     Breath sounds: Normal breath sounds.  Abdominal:     Palpations: Abdomen is soft.  Musculoskeletal:     Cervical back: Full passive range of motion without pain and normal range of motion. No rigidity or tenderness. No pain with movement or muscular tenderness. Normal range of motion.  Lymphadenopathy:     Cervical: No cervical adenopathy.  Neurological:     Mental Status: He is alert and oriented to person, place, and time.      UC Treatments / Results  Labs (all labs ordered are listed, but only  abnormal results are displayed) Labs Reviewed  POCT RAPID STREP A (OFFICE)    EKG   Radiology CT ANGIO HEAD NECK W WO CM Result Date: 10/16/2023 CLINICAL DATA:  Headache with increasing frequency or severity. Abnormal CT of the head with age indeterminate perforator infarct EXAM: CT  ANGIOGRAPHY HEAD AND NECK WITH AND WITHOUT CONTRAST TECHNIQUE: Multidetector CT imaging of the head and neck was performed using the standard protocol during bolus administration of intravenous contrast. Multiplanar CT image reconstructions and MIPs were obtained to evaluate the vascular anatomy. Carotid stenosis measurements (when applicable) are obtained utilizing NASCET criteria, using the distal internal carotid diameter as the denominator. RADIATION DOSE REDUCTION: This exam was performed according to the departmental dose-optimization program which includes automated exposure control, adjustment of the mA and/or kV according to patient size and/or use of iterative reconstruction technique. CONTRAST:  75mL OMNIPAQUE IOHEXOL 350 MG/ML SOLN COMPARISON:  CT head without contrast 10/16/2023 at 10 15 a.m. FINDINGS: CTA NECK FINDINGS Aortic arch: Atherosclerotic calcifications are present at the aortic arch. Great vessel origins are within normal limits. No focal stenosis or aneurysm present. No dissection is present. Right carotid system: The right common carotid artery is within normal limits. Calcifications are present at the bifurcation and proximal right ICA without significant stenosis. The cervical right ICA is otherwise normal. Left carotid system: Left common carotid artery is within normal limits. Minimal calcifications are present bifurcation without significant stenosis. The cervical left ICA is normal. Vertebral arteries: The right vertebral artery is dominant. Both vertebral arteries originate from the subclavian arteries without focal stenosis. No significant stenosis is present in either vertebral artery in the  neck. Skeleton: Solid fusion is present cervical spine from C2 through C7. No focal osseous lesions are present. Other neck: The soft tissues of the neck are otherwise unremarkable. Salivary glands are within normal limits. Thyroid is normal. No significant adenopathy is present. No focal mucosal or submucosal lesions are present. Upper chest: The lung apices are clear. The thoracic inlet is within normal limits. Review of the MIP images confirms the above findings CTA HEAD FINDINGS Anterior circulation: Minimal atherosclerotic calcifications are present within the cavernous internal carotid arteries bilaterally without significant stenosis through the ICA termini. The A1 and M1 segments are normal. The anterior communicating artery is patent. MCA bifurcations are within normal limits bilaterally. The ACA and MCA branch vessels are normal bilaterally. No aneurysm is present. Posterior circulation: Vertebral arteries scratched at PICA origins are visualized and normal. The vertebrobasilar junction and basilar artery normal. The superior cerebellar arteries are patent bilaterally. Both posterior cerebral arteries originate from basilar tip. The PCA branch vessels are normal bilaterally. No aneurysm is present. Venous sinuses: The dural sinuses are patent. The straight sinus and deep cerebral veins are intact. Cortical veins are within normal limits. No significant vascular malformation is evident. Anatomic variants: None Review of the MIP images confirms the above findings IMPRESSION: 1. Minimal atherosclerotic changes at the carotid bifurcations and cavernous internal carotid arteries bilaterally without significant stenosis. 2. No significant proximal stenosis, aneurysm, or branch vessel occlusion within the Circle of Willis. 3. Solid fusion from C2 through C7. 4.  Aortic Atherosclerosis (ICD10-I70.0). Electronically Signed   By: Marin Roberts M.D.   On: 10/16/2023 16:58   CT Head Wo Contrast Result Date:  10/16/2023 CLINICAL DATA:  Headache with increasing frequency or severity. EXAM: CT HEAD WITHOUT CONTRAST TECHNIQUE: Contiguous axial images were obtained from the base of the skull through the vertex without intravenous contrast. RADIATION DOSE REDUCTION: This exam was performed according to the departmental dose-optimization program which includes automated exposure control, adjustment of the mA and/or kV according to patient size and/or use of iterative reconstruction technique. COMPARISON:  06/25/2011 FINDINGS: Brain: Vague band of low-density at the left basal ganglia and corona  radiata. Chronic small vessel disease in the cerebral white matter. No evidence of acute infarct, hemorrhage, hydrocephalus, or collection. Mild cerebral volume loss for age Vascular: No hyperdense vessel or unexpected calcification. Skull: Band of high-density in the left posterior scalp, scarring when compared to 2012. 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 Electronically Signed   By: Tiburcio Pea M.D.   On: 10/16/2023 10:32    Procedures Procedures (including critical care time)  Medications Ordered in UC Medications - No data to display  Initial Impression / Assessment and Plan / UC Course  I have reviewed the triage vital signs and the nursing notes.  Pertinent labs & imaging results that were available during my care of the patient were reviewed by me and considered in my medical decision making (see chart for details).  Afebrile. Discussed reassuring imaging 24 hours ago, not likely underlying structural issue. No obvious abnormality on physical exam. No red flags. A strep test obtained just to rule out, returns negative.   Consider patient concern for lymph nodes swollen as reaction to contrast - was given as injection, not swallowed. Patient without shortness of breath or trouble breathing. No rash, hives, or abd  pain. No pain in throat. No LAD is palpated at this time. Advised monitor for a few days. Any changes, be seen again in the ED. Patient agrees to plan  Final Clinical Impressions(s) / UC Diagnoses   Final diagnoses:  LAD (lymphadenopathy)     Discharge Instructions      por favor controle los sntomas. Si presenta dolor en la garganta o el cuello, o cualquier dificultad para tragar o respirar, acuda al servicio de urgencias.     ED Prescriptions   None    PDMP not reviewed this encounter.   Marlow Baars, [place] 10/17/23 1715

## 2023-10-17 NOTE — Discharge Instructions (Signed)
 por favor controle los sntomas. Si presenta dolor en la garganta o el cuello, o cualquier dificultad para tragar o respirar, acuda al servicio de urgencias.

## 2023-11-13 ENCOUNTER — Ambulatory Visit (INDEPENDENT_AMBULATORY_CARE_PROVIDER_SITE_OTHER): Payer: Medicare Other

## 2023-11-13 DIAGNOSIS — I442 Atrioventricular block, complete: Secondary | ICD-10-CM | POA: Diagnosis not present

## 2023-11-13 LAB — CUP PACEART REMOTE DEVICE CHECK
Battery Remaining Longevity: 47 mo
Battery Remaining Percentage: 49 %
Battery Voltage: 2.98 V
Brady Statistic AP VP Percent: 22 %
Brady Statistic AP VS Percent: 0 %
Brady Statistic AS VP Percent: 78 %
Brady Statistic AS VS Percent: 0 %
Brady Statistic RA Percent Paced: 1 %
Brady Statistic RV Percent Paced: 99 %
Date Time Interrogation Session: 20250328020014
Implantable Lead Connection Status: 753985
Implantable Lead Connection Status: 753985
Implantable Lead Implant Date: 20110310
Implantable Lead Implant Date: 20110310
Implantable Lead Location: 753859
Implantable Lead Location: 753860
Implantable Pulse Generator Implant Date: 20200701
Lead Channel Impedance Value: 290 Ohm
Lead Channel Impedance Value: 310 Ohm
Lead Channel Pacing Threshold Amplitude: 0.5 V
Lead Channel Pacing Threshold Amplitude: 0.75 V
Lead Channel Pacing Threshold Pulse Width: 0.5 ms
Lead Channel Pacing Threshold Pulse Width: 0.5 ms
Lead Channel Sensing Intrinsic Amplitude: 1.6 mV
Lead Channel Sensing Intrinsic Amplitude: 6.6 mV
Lead Channel Setting Pacing Amplitude: 2 V
Lead Channel Setting Pacing Amplitude: 2.5 V
Lead Channel Setting Pacing Pulse Width: 0.5 ms
Lead Channel Setting Sensing Sensitivity: 5 mV
Pulse Gen Model: 2272
Pulse Gen Serial Number: 3315946

## 2023-12-22 NOTE — Progress Notes (Signed)
 Remote pacemaker transmission.

## 2024-02-12 ENCOUNTER — Ambulatory Visit (INDEPENDENT_AMBULATORY_CARE_PROVIDER_SITE_OTHER): Payer: Medicare Other

## 2024-02-12 DIAGNOSIS — I442 Atrioventricular block, complete: Secondary | ICD-10-CM

## 2024-02-12 LAB — CUP PACEART REMOTE DEVICE CHECK
Battery Remaining Longevity: 45 mo
Battery Remaining Percentage: 46 %
Battery Voltage: 2.98 V
Brady Statistic AP VP Percent: 55 %
Brady Statistic AP VS Percent: 0 %
Brady Statistic AS VP Percent: 45 %
Brady Statistic AS VS Percent: 0 %
Brady Statistic RA Percent Paced: 1 %
Brady Statistic RV Percent Paced: 99 %
Date Time Interrogation Session: 20250627020015
Implantable Lead Connection Status: 753985
Implantable Lead Connection Status: 753985
Implantable Lead Implant Date: 20110310
Implantable Lead Implant Date: 20110310
Implantable Lead Location: 753859
Implantable Lead Location: 753860
Implantable Pulse Generator Implant Date: 20200701
Lead Channel Impedance Value: 310 Ohm
Lead Channel Impedance Value: 310 Ohm
Lead Channel Pacing Threshold Amplitude: 0.5 V
Lead Channel Pacing Threshold Amplitude: 0.75 V
Lead Channel Pacing Threshold Pulse Width: 0.5 ms
Lead Channel Pacing Threshold Pulse Width: 0.5 ms
Lead Channel Sensing Intrinsic Amplitude: 1.6 mV
Lead Channel Sensing Intrinsic Amplitude: 7 mV
Lead Channel Setting Pacing Amplitude: 2 V
Lead Channel Setting Pacing Amplitude: 2.5 V
Lead Channel Setting Pacing Pulse Width: 0.5 ms
Lead Channel Setting Sensing Sensitivity: 5 mV
Pulse Gen Model: 2272
Pulse Gen Serial Number: 3315946

## 2024-02-15 ENCOUNTER — Ambulatory Visit: Payer: Self-pay | Admitting: Internal Medicine

## 2024-02-23 ENCOUNTER — Other Ambulatory Visit: Payer: Self-pay | Admitting: Internal Medicine

## 2024-02-23 DIAGNOSIS — I480 Paroxysmal atrial fibrillation: Secondary | ICD-10-CM

## 2024-02-23 NOTE — Telephone Encounter (Signed)
 Refill Request.

## 2024-02-23 NOTE — Telephone Encounter (Signed)
 Prescription refill request for Eliquis  received. Indication: AF Last office visit: 08/04/23   Scr: 0.98 on 02/04/24  Epic Age: 83 Weight: 82.5kg  Based on above findings Eliquis  5mg  twice daily is the appropriate dose.  Refill approved.

## 2024-03-04 ENCOUNTER — Telehealth: Payer: Self-pay

## 2024-03-04 NOTE — Telephone Encounter (Signed)
 REQUEST JUST SAYS FROM [facility]     Pre-operative Risk Assessment    Patient Name: Joseph Austin  DOB: Sep 03, 1940 MRN: [id number]   Date of last office visit: 08/04/23 CHARLIES ARTHUR, PA-C Date of next office visit: 08/02/24 DANELLE BIRMINGHAM, MD  Request for Surgical Clearance    Procedure:  Dental Extraction - Amount of Teeth to be Pulled:  4 TEETH [phone])  Date of Surgery:  Clearance TBD                                Surgeon:  NOT INDICATED Surgeon's Group or Practice Name:  NOT INDICATED Phone number:  NOT INDICATED Fax number:  NOT INDICATED   Type of Clearance Requested:   - Medical  - Pharmacy:  Hold Apixaban  (Eliquis )     Type of Anesthesia:  PRILOCAINE 4%   Additional requests/questions:    Signed, Lucie DELENA Ku   03/04/2024, 9:34 AM

## 2024-03-07 NOTE — Telephone Encounter (Signed)
 I s/w the [employer] and we both s/w the pt's daughter in regard to help obtain who the DDS is so we may call and obtain the needed information for the dental procedure.   Interpreter: [id number]  UPDATE ON CLEARANCE FORM INFORMATION:   DR. [facility], DDS  [facility]  PH#: [phone] FAX#: [phone]

## 2024-03-07 NOTE — Telephone Encounter (Signed)
 Per DDS office unknown yet if the extractions will be simple or surgical, will not know until the procedure is being done. Would like recommendations for both if surgical is different than the the simple extraction recommendations.

## 2024-03-09 NOTE — Telephone Encounter (Signed)
 Patient with diagnosis of afib on Eliquis  for anticoagulation.    Procedure: Dental Extraction - Amount of Teeth to be Pulled:  4 TEETH (#23,24,25,26) Date of procedure: TBD   CHA2DS2-VASc Score = 4   This indicates a 4.8% annual risk of stroke. The patient's score is based upon: CHF History: 0 HTN History: 1 Diabetes History: 1 Stroke History: 0 Vascular Disease History: 0 Age Score: 2 Gender Score: 0      CrCl 62 ml/min Platelet count 177  Patient has not had an Afib/aflutter ablation within the last 3 months or DCCV within the last 30 days  Patient does not require pre-op antibiotics for dental procedure.  Per office protocol, patient can hold Eliquis  for 1 day prior to procedure.    **This guidance is not considered finalized until pre-operative APP has relayed final recommendations.**

## 2024-03-09 NOTE — Telephone Encounter (Signed)
 Please comment on pacemaker for surgical clearance.  DR. LAURAINE SKEANS, DDS  [facility]  PH#: [phone] FAX#: [phone]

## 2024-03-11 NOTE — Telephone Encounter (Signed)
   Patient Name: Joseph Austin  DOB: 1941-05-09 MRN: [id number]  Primary Cardiologist: Danelle Birmingham, MD  Chart reviewed as part of pre-operative protocol coverage. Given past medical history and time since last visit, based on ACC/AHA guidelines, Vaun Hyndman is at acceptable risk for the planned procedure without further cardiovascular testing. Sent message to Device clinic. They will reply separately.   Per Pharmacy Per office protocol, patient can hold Eliquis  for 1 day prior to procedure.    The patient was advised that if he develops new symptoms prior to surgery to contact our office to arrange for a follow-up visit, and he verbalized understanding.  I will route this recommendation to the requesting party via Epic fax function and remove from pre-op pool.  Please call with questions.  Lamarr Satterfield, NP 03/11/2024, 9:34 AM

## 2024-03-17 ENCOUNTER — Telehealth: Payer: Self-pay | Admitting: Internal Medicine

## 2024-03-17 NOTE — Telephone Encounter (Signed)
 Called patient back about message. Patient's daughter [facility]) stated that Dr. Delilah, patient's PCP, decreased patient hydrochlorothiazide  to 12.5 mg about 3 months ago and ever since his BP has been creeping up. Patient's BP was really high on Monday 172/98, and it had been staying high over the week. Encouraged patient's daughter to call Dr. Delilah since he changed patient's medication and he should ultimately make those changes back if needed. Will send message to Dr. Babette for any further advisement.

## 2024-03-17 NOTE — Telephone Encounter (Signed)
 Pt c/o BP issue: STAT if pt c/o blurred vision, one-sided weakness or slurred speech  1. What are your last 5 BP readings? 172/98  2. Are you having any other symptoms (ex. Dizziness, headache, blurred vision, passed out)? Headaches   3. What is your BP issue? Pt and his daughter is very concerned about his BP being elevated lately and medication changes being made with PCP. Please advise

## 2024-04-21 NOTE — Progress Notes (Signed)
 Remote pacemaker transmission.

## 2024-04-27 NOTE — Telephone Encounter (Signed)
Sent to device clinic 

## 2024-04-27 NOTE — Telephone Encounter (Signed)
 Will forward to preop APP to confirm if pt has been cleared. I have reviewed the notes in the chart from 03/04/24, whic does seem to reflect the pt has been cleared. Want to be sure I am not missing anything.

## 2024-05-09 ENCOUNTER — Telehealth: Payer: Self-pay

## 2024-05-09 NOTE — Telephone Encounter (Signed)
   Pre-operative Risk Assessment    Patient Name: Joseph Austin  DOB: 1940-12-28 MRN: [id number]   Date of last office visit: 08/04/23 CHARLIES ARTHUR, PA-C Date of next office visit: 08/02/24 DANELLE BIRMINGHAM, MD  Request for Surgical Clearance    Procedure:  Dental Extraction - Amount of Teeth to be Pulled:  11 TEETH  Date of Surgery:  Clearance TBD                                Surgeon:  DR KATHERINE JONES, DDS Surgeon's Group or Practice Name:  [facility] Phone number:  [phone] Fax number:  [phone]   Type of Clearance Requested:   - Medical  - Pharmacy:  Hold Apixaban  (Eliquis )     Type of Anesthesia:  Not Indicated   Additional requests/questions:    Signed, Lucie DELENA Ku   05/09/2024, 12:52 PM

## 2024-05-09 NOTE — Telephone Encounter (Signed)
 Pharmacy, can you please comment on how long Eliquis  can be held for 11 teeth extractions? He was recently given the okay to hold Eliquis  for 1 day for 4 teeth extractions but just want to make sure recommendations is not different given more extractions this time.  Thank you!

## 2024-05-11 NOTE — Telephone Encounter (Signed)
 I send this to preop APP to review the notes. I think there is a question about Eliquis .

## 2024-05-11 NOTE — Telephone Encounter (Signed)
 Just updating the chart as to this message was sent to me today. Looks like the preop APP sent to pharm-d 05/09/24 about Eliquis .   Me    05/11/24  2:22 PM Note I send this to preop APP to review the notes. I think there is a question about Eliquis .     Chauvigne, Carlyle, RN to [facility]     05/06/24 10:00 AM Hi Mr. Moone,    We do not have forms that we send over for clearance. Typically the office requesting the clearance (in this case your dentist) would send the form over to us  to sign. The only thing that I would be able to provide you with would be a letter stating that you can hold your Elquis for one day. If that is all they are needing just let me know and I can get that sent over.    Thanks Carly  Last read by Emil GENEVIA at 11:59AM on 05/09/2024. Lucien Orren SAILOR, PA-C to [facility]     05/06/24  9:07 AM Hey all,   I am not sure how to do this or if we have done this in the past.  I cannot sign anything because him not in the clinic today.  Is there a way we can get someone to sign off on this person's clearance?   Orren SAILOR Lucien, PA-C   05/06/24  8:52 AM Lenora Been R routed this conversation to [facility] Callback (Selected Message)    05/05/24  5:08 PM Drena Noel Males, RN routed this conversation to [facility] [place] to P [facility] (supporting Charlies Macario Arthur, PA-C)     05/05/24  4:13 PM Hi , is it possible for you to fax over the medical clearance form to the dentist because they said they need a provider signature. Their fax number is [phone] Thank you [facility] to P [facility] (supporting Charlies Macario Arthur, PA-C)     05/05/24  9:38 AM Hello, Good morning,  Thank you .Have a beatifull day. Alvia Lucie LABOR, CMA to [facility]     05/04/24  9:32 AM Good morning,   Per the Clearance: patient can hold Eliquis  for 1 day prior to procedure.      I hope that you have a great day.  Last read by Emil GENEVIA at 11:59AM on 05/09/2024.   05/03/24  3:37 PM Casimir Aldona BRAVO, RN routed this conversation to [facility] Preop Callback  [place] to P [facility] (supporting Charlies Macario Arthur, PA-C)     05/03/24  9:56 AM Hello, good morning, when do you think you can answer how many days I have to stop taking the eliquis  pill for my dental extractions . I have been waiting for 2 months [facility] Marcelino to P [facility] (supporting Charlies Macario Arthur, PA-C)     04/28/24  3:56 PM Ok Will be waiting for your response thanks Me    04/27/24 12:48 PM Note Sent to device clinic.      Lucien Orren SAILOR, PA-C to Me  Jerilynn Lamarr HERO, NP     04/27/24 12:36 PM This looks to me like it was completed almost 2 months ago.  They might be referring to device clearance which does not compromise.  Please let me know if there is any other way we can help.   Thanks! Orren SAILOR Lucien, PA-C Elkins, Jenna M,  RN to [facility]     04/27/24 12:24 PM We will let you know. It is under review.   Last read by Emil SOLO at 8:46AM on 05/06/2024. Layden Nickless to P [facility] (supporting Charlies Macario Arthur, [place])     04/27/24 12:06 PM Ok thank you please let me know Me to [facility] Preop    04/27/24 12:00 PM Note Will forward to preop APP to confirm if pt has been cleared. I have reviewed the notes in the chart from 03/04/24, whic does seem to reflect the pt has been cleared. Want to be sure I am not missing anything.      Loring Andriette HERO, RN to [facility]  [facility] Callback     04/27/24 11:40 AM See 03/04/24 clearance Elkins, Jenna M, RN to [facility]     04/27/24 11:39 AM I see a clearance request from July -- I'm not certain it has been fully addressed by all parties, so I'll send over to our pre-op team again.   Last read by Emil SOLO at 8:46AM on  05/06/2024. Daxtin Tooker to P [facility] (supporting Charlies Macario Arthur, PA-C)     04/27/24 11:25 AM Hi good morning, have you recieved the clearance request yet?  Thanks Chauvigne, Carlyle, RN to [facility]     04/22/24 10:34 AM Hi Lisa,    The provider that is doing your tooth extraction will need to send us  over a clearance request and then we can address.    Thanks, Carly  Last read by Emil SOLO at 8:46AM on 05/06/2024. Maxi Remmers to P [facility] (supporting Charlies Macario Arthur, PA-C)     04/22/24 10:23 AM Hello, good morning,my name is Braman, I am writing to you, because i am going to do tooth extractions, and I need you to send me to this # [phone], how many days - I have to stop taking the antiquagulant. Thank you very much  , i need a clearance form

## 2024-05-12 NOTE — Telephone Encounter (Signed)
 He can hold Eliquis  1 day prior

## 2024-05-12 NOTE — Telephone Encounter (Signed)
   Name: Joseph Austin  DOB: 1940-10-23  MRN: [id number]  Primary Cardiologist: Danelle Birmingham, MD   Preoperative team, please contact this patient and set up a phone call appointment for further preoperative risk assessment. Please obtain consent and complete medication review. Thank you for your help.  I confirm that guidance regarding antiplatelet and oral anticoagulation therapy has been completed and, if necessary, noted below.  He can hold Eliquis  1 day prior    I also confirmed the patient resides in the state of [place] . As per [facility] Medical [facility] telemedicine laws, the patient must reside in the state in which the provider is licensed.   Josefa CHRISTELLA Beauvais, NP 05/12/2024, 4:38 PM [facility]

## 2024-05-12 NOTE — Telephone Encounter (Signed)
 Left message through [facility] [phone] to call back and schedule tele preop appt.

## 2024-05-13 ENCOUNTER — Ambulatory Visit (INDEPENDENT_AMBULATORY_CARE_PROVIDER_SITE_OTHER): Payer: Medicare Other

## 2024-05-13 DIAGNOSIS — I442 Atrioventricular block, complete: Secondary | ICD-10-CM

## 2024-05-13 LAB — CUP PACEART REMOTE DEVICE CHECK
Battery Remaining Longevity: 42 mo
Battery Remaining Percentage: 43 %
Battery Voltage: 2.98 V
Brady Statistic AP VP Percent: 41 %
Brady Statistic AP VS Percent: 0 %
Brady Statistic AS VP Percent: 58 %
Brady Statistic AS VS Percent: 1 %
Brady Statistic RA Percent Paced: 1 %
Brady Statistic RV Percent Paced: 99 %
Date Time Interrogation Session: 20250926020014
Implantable Lead Connection Status: 753985
Implantable Lead Connection Status: 753985
Implantable Lead Implant Date: 20110310
Implantable Lead Implant Date: 20110310
Implantable Lead Location: 753859
Implantable Lead Location: 753860
Implantable Pulse Generator Implant Date: 20200701
Lead Channel Impedance Value: 300 Ohm
Lead Channel Impedance Value: 310 Ohm
Lead Channel Pacing Threshold Amplitude: 0.5 V
Lead Channel Pacing Threshold Amplitude: 0.75 V
Lead Channel Pacing Threshold Pulse Width: 0.5 ms
Lead Channel Pacing Threshold Pulse Width: 0.5 ms
Lead Channel Sensing Intrinsic Amplitude: 1.6 mV
Lead Channel Sensing Intrinsic Amplitude: 12 mV
Lead Channel Setting Pacing Amplitude: 2 V
Lead Channel Setting Pacing Amplitude: 2.5 V
Lead Channel Setting Pacing Pulse Width: 0.5 ms
Lead Channel Setting Sensing Sensitivity: 5 mV
Pulse Gen Model: 2272
Pulse Gen Serial Number: 3315946

## 2024-05-15 ENCOUNTER — Ambulatory Visit: Payer: Self-pay | Admitting: Internal Medicine

## 2024-05-16 ENCOUNTER — Telehealth (HOSPITAL_BASED_OUTPATIENT_CLINIC_OR_DEPARTMENT_OTHER): Payer: Self-pay | Admitting: *Deleted

## 2024-05-16 NOTE — Telephone Encounter (Signed)
 Pt's daughter called thru an interpreter service and has scheduled a tele preop appt for her father. Tele preop appt 05/20/24 as per pt's daughter the DDS will not schedule the dental procedure until they have clearance from cardiology.   Med rec and consent are done.      Patient Consent for Virtual Visit        Joseph Austin has provided verbal consent on 05/16/2024 for a virtual visit (video or telephone).   CONSENT FOR VIRTUAL VISIT FOR:  Joseph Austin  By participating in this virtual visit I agree to the following:  I hereby voluntarily request, consent and authorize [facility] and its employed or contracted physicians, physician assistants, nurse practitioners or other licensed health care professionals (the Practitioner), to provide me with telemedicine health care services (the "Services) as deemed necessary by the treating Practitioner. I acknowledge and consent to receive the Services by the Practitioner via telemedicine. I understand that the telemedicine visit will involve communicating with the Practitioner through live audiovisual communication technology and the disclosure of certain medical information by electronic transmission. I acknowledge that I have been given the opportunity to request an in-person assessment or other available alternative prior to the telemedicine visit and am voluntarily participating in the telemedicine visit.  I understand that I have the right to withhold or withdraw my consent to the use of telemedicine in the course of my care at any time, without affecting my right to future care or treatment, and that the Practitioner or I may terminate the telemedicine visit at any time. I understand that I have the right to inspect all information obtained and/or recorded in the course of the telemedicine visit and may receive copies of available information for a reasonable fee.  I understand that some of the potential risks of receiving  the Services via telemedicine include:  Delay or interruption in medical evaluation due to technological equipment failure or disruption; Information transmitted may not be sufficient (e.g. poor resolution of images) to allow for appropriate medical decision making by the Practitioner; and/or  In rare instances, security protocols could fail, causing a breach of personal health information.  Furthermore, I acknowledge that it is my responsibility to provide information about my medical history, conditions and care that is complete and accurate to the best of my ability. I acknowledge that Practitioner's advice, recommendations, and/or decision may be based on factors not within their control, such as incomplete or inaccurate data provided by me or distortions of diagnostic images or specimens that may result from electronic transmissions. I understand that the practice of medicine is not an exact science and that Practitioner makes no warranties or guarantees regarding treatment outcomes. I acknowledge that a copy of this consent can be made available to me via my patient portal [facility] MyChart), or I can request a printed copy by calling the office of [facility].    I understand that my insurance will be billed for this visit.   I have read or had this consent read to me. I understand the contents of this consent, which adequately explains the benefits and risks of the Services being provided via telemedicine.  I have been provided ample opportunity to ask questions regarding this consent and the Services and have had my questions answered to my satisfaction. I give my informed consent for the services to be provided through the use of telemedicine in my medical care

## 2024-05-16 NOTE — Telephone Encounter (Signed)
 Pt's daughter called thru an interpreter service and has scheduled a tele preop appt for her father. Tele preop appt 05/20/24 as per pt's daughter the DDS will not schedule the dental procedure until they have clearance from cardiology.   Med rec and consent are done.

## 2024-05-18 NOTE — Progress Notes (Signed)
 Subjective :  Patient ID: Joseph Austin is a 83 y.o. male.  HPI: Dionisio Aragones comes today for f/u HTN, Diabetes, Hyperlipidemia, GERD, and Gout. He denies dysphagia, painful swallowing, N/V, abdominal pain, hematochezia or melena. No CP, SOB or leg pain with exertion. No PND, orthopnea, lightheadedness, presyncope or syncope. No wheezing, cough. No hypoglycemia. He has been checking sugars. He has no new complaints.  The following portions of the patient's history were reviewed and updated as appropriate: allergies, current medications, past family history, past medical history, past social history, past surgical history, and problem list.  Review of Systems  Constitutional:  Negative for chills, fatigue and fever.  HENT:  Negative for ear pain, hearing loss and sinus pain.   Eyes:  Negative for visual disturbance.  Respiratory:  Negative for chest tightness and shortness of breath.   Cardiovascular:  Negative for chest pain, palpitations and leg swelling.  Gastrointestinal:  Negative for abdominal pain, blood in stool, constipation, diarrhea, nausea and vomiting.  Endocrine: Negative for cold intolerance, heat intolerance, polydipsia, polyphagia and polyuria.  Genitourinary:  Negative for dysuria.  Musculoskeletal:  Negative for back pain and myalgias.  Skin:  Negative for rash.  Neurological:  Negative for dizziness, syncope, light-headedness and headaches.  Psychiatric/Behavioral:  Negative for behavioral problems.     Objective  Physical Exam Vitals and nursing note reviewed.  Constitutional:      Appearance: Normal appearance.  HENT:     Head: Normocephalic and atraumatic.     Left Ear: There is no impacted cerumen.  Eyes:     Extraocular Movements: Extraocular movements intact.     Conjunctiva/sclera: Conjunctivae normal.     Pupils: Pupils are equal, round, and reactive to light.  Neck:     Vascular: No carotid bruit.  Cardiovascular:     Rate and Rhythm:  Normal rate and regular rhythm.     Pulses: Normal pulses.          Dorsalis pedis pulses are 2+ on the right side and 2+ on the left side.       Posterior tibial pulses are 2+ on the right side and 2+ on the left side.     Heart sounds: Normal heart sounds. No murmur heard.    No gallop.  Pulmonary:     Effort: Pulmonary effort is normal.     Breath sounds: Normal breath sounds. No wheezing or rhonchi.  Abdominal:     General: Bowel sounds are normal.     Palpations: Abdomen is soft.     Tenderness: There is abdominal tenderness.  Musculoskeletal:        General: Normal range of motion.     Cervical back: Normal range of motion and neck supple. No rigidity or tenderness.     Right lower leg: No edema.     Left lower leg: No edema.     Right foot: Normal range of motion.     Left foot: Normal range of motion.  Feet:     Right foot:     Protective Sensation: 6 sites tested.  6 sites sensed.     Skin integrity: Skin integrity normal.     Toenail Condition: Right toenails are normal.     Left foot:     Protective Sensation: 6 sites tested.  6 sites sensed.     Skin integrity: Skin integrity normal.     Toenail Condition: Left toenails are normal.  Lymphadenopathy:     Cervical: No cervical adenopathy.  Skin:  General: Skin is warm.     Findings: No erythema.  Neurological:     General: No focal deficit present.     Mental Status: He is alert and oriented to person, place, and time.     Cranial Nerves: No cranial nerve deficit.  Psychiatric:        Mood and Affect: Mood normal.        Behavior: Behavior normal.        Assessment    1. Essential (primary) hypertension (Primary)  Controlled with Carvedilol  25 mg 1.5 tablets bid and hydrochlorothiazide  25 mg 1 tablet daily   PLAN:  Continue current treatment  - Basic Metabolic Panel; Future 2. Type 2 diabetes mellitus without complication, without long-term current use of insulin    (CMD)  Controlled with Janumet  50-1,000 mg   PLAN:  Continue current treatment  - Hemoglobin A1C With Estimated Average Glucose; Future  3. Hyperlipidemia LDL goal <100  Controlled with Rosuvastatin 20 mg 1 tablet daily  PLAN:  Continue current treatment  4. Gastroesophageal reflux disease without esophagitis  Controlled with Omeprazole  40 mg 1 capsule daily. No red flag symptoms.   PLAN:  Try to decrease to every other day.   5. Idiopathic chronic gout of multiple sites without tophus   Controlled with Allopurinol 300 mg 1 tablet bid  PLAN:  Continue current treatment  6. Need for influenza vaccination  PLAN: Ordered and administered during office visit today. - Flu, High-Dose, Trivalent, PF IM   Recommended to get Shingrix and Covid vaccines at the local pharmacy.  Plan  See above.

## 2024-05-18 NOTE — Progress Notes (Signed)
 Remote PPM Transmission

## 2024-05-20 ENCOUNTER — Ambulatory Visit: Attending: Cardiology

## 2024-05-20 ENCOUNTER — Telehealth: Payer: Self-pay

## 2024-05-20 DIAGNOSIS — Z01810 Encounter for preprocedural cardiovascular examination: Secondary | ICD-10-CM

## 2024-05-20 NOTE — Telephone Encounter (Signed)
 Patient with diagnosis of afib on Eliquis  for anticoagulation.    Procedure:  COLONOSCOPY  Date of procedure: TBD   CHA2DS2-VASc Score = 4   This indicates a 4.8% annual risk of stroke. The patient's score is based upon: CHF History: 0 HTN History: 1 Diabetes History: 1 Stroke History: 0 Vascular Disease History: 0 Age Score: 2 Gender Score: 0      CrCl 52 ml/min Platelet count 117  Patient has not had an Afib/aflutter ablation or Watchman within the last 3 months or DCCV within the last 30 days   Per office protocol, patient can hold Eliquis  for 2 days prior to procedure.    **This guidance is not considered finalized until pre-operative APP has relayed final recommendations.**

## 2024-05-20 NOTE — Progress Notes (Signed)
 Virtual Visit via Telephone Note   Because of Joseph Austin co-morbid illnesses, he is at least at moderate risk for complications without adequate follow up.  This format is felt to be most appropriate for this patient at this time.  Due to technical limitations with video connection Web designer), today's appointment will be conducted as an audio only telehealth visit, and Joseph Austin verbally agreed to proceed in this manner.   All issues noted in this document were discussed and addressed.  No physical exam could be performed with this format.  Evaluation Performed:  Preoperative cardiovascular risk assessment _____________   Date:  05/20/2024   Patient ID:  Joseph Austin, DOB 1941-03-21, MRN [id number] Patient Location:  Home Provider location:   Office  Primary Care Provider:  Delilah Murray HERO., MD Primary Cardiologist:  Danelle Birmingham, MD  Chief Complaint / Patient Profile   83 y.o. y/o male with a h/o CHB s/p PPM, atrial fibrillation, HTN, HLD who is pending dental extraction of 11 teeth and presents today for telephonic preoperative cardiovascular risk assessment.  History of Present Illness    Joseph Austin is a 83 y.o. male who presents via audio/video conferencing for a telehealth visit today.  Pt was last seen in cardiology clinic on 08/04/2023 by Charlies Arthur, PA.  At that time [name] was doing well with no complaints of tachycardia and normal device function.  The patient is now pending procedure as outlined above. Since his last visit, he has been doing well with no new cardiac complaints.  He denies chest pain, shortness of breath, lower extremity edema, fatigue, palpitations, melena, hematuria, hemoptysis, diaphoresis, weakness, presyncope, syncope, orthopnea, and PND.    Past Medical History    Past Medical History:  Diagnosis Date   Arthritis    Blockage of coronary artery of heart (HCC)    Complete heart block (HCC)     Depression    Gastritis    Hx of    Glaucoma    History of heart attack 1992   HTN (hypertension)    Pacemaker    Syncope    Unspecified essential hypertension    Past Surgical History:  Procedure Laterality Date   PACEMAKER INSERTION     PPM GENERATOR CHANGEOUT N/A 02/16/2019   Procedure: PPM GENERATOR CHANGEOUT;  Surgeon: Birmingham Danelle ORN, MD;  Location: [facility] INVASIVE CV LAB;  Service: Cardiovascular;  Laterality: N/A;    Allergies  No Known Allergies  Home Medications    Prior to Admission medications   Medication Sig Start Date End Date Taking? Authorizing Provider  acetaminophen  (TYLENOL ) 325 MG tablet Take 325 mg by mouth every 6 (six) hours as needed for moderate pain (pain score 4-6) or headache.    [provider]  allopurinol (ZYLOPRIM) 300 MG tablet Take 300 mg by mouth daily.    [provider]  carvedilol  (COREG ) 25 MG tablet Take 1.5 tablets (37.5 mg total) by mouth 2 (two) times daily with a meal. 09/25/23   Birmingham Danelle ORN, MD  dorzolamide-timolol (COSOPT) 22.3-6.8 MG/ML ophthalmic solution Place 1 drop into both eyes 2 (two) times daily.    [provider]  ELIQUIS  5 MG TABS tablet TAKE 1 TABLET(5 MG) BY MOUTH TWICE DAILY 02/23/24   Birmingham Danelle ORN, MD  hydrochlorothiazide  (HYDRODIURIL ) 12.5 MG tablet Take 12.5 mg by mouth daily.    [provider]  hydrochlorothiazide  (HYDRODIURIL ) 25 MG tablet TAKE 1 TABLET(25 MG) BY MOUTH DAILY Patient not taking: Reported on 05/16/2024 08/10/23  [name], MD  JANUMET 50-1000 MG tablet Take 1 tablet by mouth 2 (two) times daily. 02/15/21   [provider]  rosuvastatin (CRESTOR) 20 MG tablet Take 20 mg by mouth daily. 10/05/19   [provider]    Physical Exam    Vital Signs:  Joseph Austin does not have vital signs available for review today.  Given telephonic nature of communication, physical exam is limited. AAOx3. NAD. Normal affect.  Speech and respirations are  unlabored.  Accessory Clinical Findings    None  Assessment & Plan    1.  Preoperative Cardiovascular Risk Assessment: - Patient's RCRI score is 0.4%  The patient affirms he has been doing well without any new cardiac symptoms. They are able to achieve 6 METS without cardiac limitations. Therefore, based on ACC/AHA guidelines, the patient would be at acceptable risk for the planned procedure without further cardiovascular testing. The patient was advised that if he develops new symptoms prior to surgery to contact our office to arrange for a follow-up visit, and he verbalized understanding.   The patient was advised that if he develops new symptoms prior to surgery to contact our office to arrange for a follow-up visit, and he verbalized understanding.  Patient instructed to hold Eliquis  2 days prior to procedure and should restart postprocedure when surgically safe and hemostasis is achieved  A copy of this note will be routed to requesting surgeon.  Time:   Today, I have spent 7 minutes with the patient with telehealth technology discussing medical history, symptoms, and management plan.     Wyn Raddle, Jackee Shove, NP  05/20/2024, 7:22 AM

## 2024-05-20 NOTE — Telephone Encounter (Signed)
   Pre-operative Risk Assessment    Patient Name: Joseph Austin  DOB: [birth year] MRN: [id number]   Date of last office visit: 08/04/23 CHARLIES ARTHUR, PA-C Date of next office visit: 08/02/24 DANELLE BIRMINGHAM, MD   Request for Surgical Clearance    Procedure:  COLONOSCOPY  Date of Surgery:  Clearance TBD                                Surgeon:  DR JAMA Surgeon's Group or Practice Name:  [facility] Phone number:  [phone] Fax number:  [phone]   Type of Clearance Requested:   - Medical  - Pharmacy:  Hold Apixaban  (Eliquis ) 2 DAYS PRIOR   Type of Anesthesia:  Not Indicated   Additional requests/questions:    Signed, Lucie DELENA Ku   05/20/2024, 12:01 PM

## 2024-05-20 NOTE — Telephone Encounter (Signed)
   Name: Joseph Austin  DOB: 1941-01-29  MRN: [id number]  Primary Cardiologist: Danelle Birmingham, MD  Chart reviewed as part of pre-operative protocol coverage. The patient has an upcoming visit scheduled with Jackee Alberts, NP  on 05/20/2024 at which time clearance can be addressed in case there are any issues that would impact surgical recommendations.  Colonoscopy Is not scheduled until TBD as below. I added preop FYI to appointment note so that provider is aware to address at time of outpatient visit.  Per office protocol the cardiology provider should forward their finalized clearance decision and recommendations regarding antiplatelet therapy to the requesting party below.    This message will also be routed to pharmacy pool for input on holding Eliquis  as requested below so that this information is available to the clearing provider at time of patient's appointment.   I will route this message as FYI to requesting party and remove this message from the preop box as separate preop APP input not needed at this time.   Please call with any questions.  Lamarr Satterfield, NP  05/20/2024, 12:06 PM

## 2024-08-02 ENCOUNTER — Ambulatory Visit: Admitting: Internal Medicine

## 2024-08-09 NOTE — Progress Notes (Addendum)
 " Cardiology Office Note:  .   Date:  08/09/2024  ID:  Joseph Austin, DOB [birth year], MRN [id number] PCP: Delilah Murray HERO., MD  [facility] Providers Cardiologist:  Danelle Birmingham, MD Electrophysiologist:  Danelle Birmingham, MD {  History of Present Illness: Joseph   Trevel Austin is a 83 y.o. male w/PMHx of HTN, HLD,  CHB w/PPM, Afib  He saw Dr. Birmingham Jan 2024, struggling with back pain, had converted to SR since his last visit, described as persistent, no changes were made.  He saw his PMD 05/04/23, f/u on abnormal ABIs described as asymptomatic.  BP was a bit low and his hydrochlorothiazide  dose reduced   I saw him 05/27/23 Today's visit is assisted by official Spanish translator Velinda He reports doing well, for an 83 y/o No CP Infrequently aware of some flutter in his heart beat No symptoms of dizziness, near syncope or syncope. No SOB, DOE He reports goo compliance with his medicines, has held his OAC for back injections intermittently, but only then His back being his biggest limiter, suffers with the pain quite a bit, looking to get another injection soon he hopes. Was in AFlutter (65% burden) Noted rare noise (both leads) unable to reproduce V lead noise only occurred simultaneously with A lead and suspect external perhaps A lead has occurred independently of the V lead Noise reversion occurs + A lead noise briefly with hand rubbing RV lead sensitivity adjusted up to 5.0 > dependent  I saw him 08/04/23 Today's visit is aided by [employer] Spanish translator Raquel He is accompanied by his daughter Back continues to be his limiting factor No CP Rare and fleeting slight palpitations No SOB, DOE No near syncope or syncope. Slight nose bleeding rarely Gets some gum bleeding with brushing, is pending extraction of the remainder of his teeth, starts that process in January, sees the oral surgeon in 2 weeks 2 brief noise episodes noted on both leads Unable to  reproduce noise RV lead, some on RA lead No clear external sources that he could think of    TODAY's visit is an annual device visit Assisted by [employer] (Spanish) ROS:   He comes today accompanied by his daughter He has arthritis that bothers him, shoulder and hands especially No CP, palpitations, cardiac awareness No dizzy spells, near syncope or syncope  No bleeding, signs of bleeding  He is pending colonoscopy Jan 6, will need 2 days off his [facility]   Device information Abbott dual chamber PPM implanted 10/25/2009, gen change 02/16/2019  Has been seen to have some noise (both leads simultaneously), occ noise has been reproducible RA lead in past appts  Arrhythmia/AAD hx Dx goes back to 2019 at least  No AAD to date that I find  Studies Reviewed: Joseph    EKG not done today  DEVICE interrogation done today and reviewed by myself Battery and lead measurements are stable Both appear to have chronically lower impedances,  slight downward trend 100% AMS, AFlutter Rate controlled >99% VP + noise episodes noted on both A and V leads When noise is seen on the V lead, also on A lead Can have A lead noise without noted V lead noise I am able to produce noise on the RV lead today with pocket manipulation that did inhibit pacing He is dependent at 40bpm  Programmed VOO 60  08/25/2018: stress myoview  Nuclear stress EF: 61%. Blood pressure demonstrated a normal response to exercise. There was no ST segment deviation noted  during stress. Findings consistent with prior myocardial infarction. This is a low risk study. The left ventricular ejection fraction is normal (55-65%).   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   Wt Readings from Last 3 Encounters:  10/16/23 181 lb 14.1 oz (82.5 kg)  08/04/23 181 lb 12.8 oz (82.5 kg)  05/27/23 181 lb (82.1 kg)     GEN: Well nourished, well developed in no acute distress NECK: No JVD; No carotid bruits CARDIAC: RRR (paced), no murmurs, rubs, gallops RESPIRATORY: CTA b/l without rales, wheezing or rhonchi  ABDOMEN: Soft, non-tender, non-distended EXTREMITIES: No edema; No deformity   PPM site:  is stable, no thinning, fluctuation, tethering  ASSESSMENT AND PLAN: .    Longstanding persistent AFib Atrial flutter CHA2DS2Vasc is 3, on eliquis , appropriately dosed  Asymptomatic and rate controlled Missed follow up to revisit rhythm management last year after his dental extractions, etc. 100% burden Rate controlled and asymptomatic > pending a colonoscopy on 08/23/24 Will not be able to look for rhythm control today  PPM He has had some noise on both lead observed/described for years, suspect to have been external V lead sensitivity adjusted and had been unable to reproduce in the office previously TODAY pocket manipulation easily causes noise on his V lead that is large and inhibits pacing He is dependent at 40bpm today  Called/discussed with Dr. Cindie.   Programmed VOO referred to the ER to be monitored until lead revision can be done (Suspect fracture, ? Header issue) Discussed concerns, findings today, given he is dependent, recommend ER/admission until the device can be addressed/revised  both the patient and daughter state understading and are grateful She will bring him to [facility] from the office I have made our EP APP and cardiology cardmaster aware  Secondary hypercoagulable state 2/2 AFib    Dispo: ER as discussed > f/u pending post hospitalization recs  Signed, Charlies Macario Arthur, PA-C   "

## 2024-08-12 ENCOUNTER — Ambulatory Visit: Payer: Medicare Other

## 2024-08-12 ENCOUNTER — Other Ambulatory Visit: Payer: Self-pay

## 2024-08-12 ENCOUNTER — Ambulatory Visit: Attending: Physician Assistant | Admitting: Physician Assistant

## 2024-08-12 ENCOUNTER — Emergency Department (HOSPITAL_COMMUNITY)

## 2024-08-12 ENCOUNTER — Inpatient Hospital Stay (HOSPITAL_COMMUNITY)
Admission: EM | Admit: 2024-08-12 | Discharge: 2024-08-14 | DRG: 309 | Disposition: A | Discharge status: Home / Self Care | Attending: Internal Medicine | Admitting: Internal Medicine

## 2024-08-12 VITALS — BP 110/64 | HR 60 | Ht 68.0 in | Wt 196.0 lb

## 2024-08-12 DIAGNOSIS — I4811 Longstanding persistent atrial fibrillation: Secondary | ICD-10-CM

## 2024-08-12 DIAGNOSIS — I10 Essential (primary) hypertension: Secondary | ICD-10-CM | POA: Diagnosis present

## 2024-08-12 DIAGNOSIS — D6869 Other thrombophilia: Secondary | ICD-10-CM

## 2024-08-12 DIAGNOSIS — T82111A Breakdown (mechanical) of cardiac pulse generator (battery), initial encounter: Secondary | ICD-10-CM | POA: Diagnosis present

## 2024-08-12 DIAGNOSIS — Z950 Presence of cardiac pacemaker: Principal | ICD-10-CM

## 2024-08-12 DIAGNOSIS — Y712 Prosthetic and other implants, materials and accessory cardiovascular devices associated with adverse incidents: Secondary | ICD-10-CM | POA: Diagnosis present

## 2024-08-12 DIAGNOSIS — I442 Atrioventricular block, complete: Secondary | ICD-10-CM | POA: Diagnosis not present

## 2024-08-12 DIAGNOSIS — I4892 Unspecified atrial flutter: Secondary | ICD-10-CM

## 2024-08-12 DIAGNOSIS — Z79899 Other long term (current) drug therapy: Secondary | ICD-10-CM

## 2024-08-12 DIAGNOSIS — I252 Old myocardial infarction: Secondary | ICD-10-CM

## 2024-08-12 DIAGNOSIS — E119 Type 2 diabetes mellitus without complications: Secondary | ICD-10-CM

## 2024-08-12 DIAGNOSIS — I4821 Permanent atrial fibrillation: Secondary | ICD-10-CM | POA: Diagnosis present

## 2024-08-12 DIAGNOSIS — T82110A Breakdown (mechanical) of cardiac electrode, initial encounter: Principal | ICD-10-CM | POA: Diagnosis present

## 2024-08-12 DIAGNOSIS — Z87891 Personal history of nicotine dependence: Secondary | ICD-10-CM

## 2024-08-12 DIAGNOSIS — E785 Hyperlipidemia, unspecified: Secondary | ICD-10-CM | POA: Diagnosis present

## 2024-08-12 DIAGNOSIS — Z7984 Long term (current) use of oral hypoglycemic drugs: Secondary | ICD-10-CM

## 2024-08-12 DIAGNOSIS — Z833 Family history of diabetes mellitus: Secondary | ICD-10-CM

## 2024-08-12 DIAGNOSIS — Z7901 Long term (current) use of anticoagulants: Secondary | ICD-10-CM

## 2024-08-12 LAB — BASIC METABOLIC PANEL WITH GFR
Anion gap: 11 (ref 5–15)
BUN: 23 mg/dL (ref 8–23)
CO2: 21 mmol/L — ABNORMAL LOW (ref 22–32)
Calcium: 9.8 mg/dL (ref 8.9–10.3)
Chloride: 102 mmol/L (ref 98–111)
Creatinine, Ser: 0.88 mg/dL (ref 0.61–1.24)
GFR, Estimated: >60 mL/min
Glucose, Bld: 114 mg/dL — ABNORMAL HIGH (ref 70–99)
Potassium: 4.6 mmol/L (ref 3.5–5.1)
Sodium: 134 mmol/L — ABNORMAL LOW (ref 135–145)

## 2024-08-12 LAB — CUP PACEART INCLINIC DEVICE CHECK
Battery Remaining Longevity: 40 mo
Battery Voltage: 2.96 V
Brady Statistic RA Percent Paced: 0 %
Brady Statistic RV Percent Paced: 99.79 %
Date Time Interrogation Session: 20251226171237
Implantable Lead Connection Status: 753985
Implantable Lead Connection Status: 753985
Implantable Lead Implant Date: 20110310
Implantable Lead Implant Date: 20110310
Implantable Lead Location: 753859
Implantable Lead Location: 753860
Implantable Pulse Generator Implant Date: 20200701
Lead Channel Pacing Threshold Amplitude: 1 V
Lead Channel Pacing Threshold Amplitude: 1 V
Lead Channel Pacing Threshold Pulse Width: 0.5 ms
Lead Channel Pacing Threshold Pulse Width: 0.5 ms
Lead Channel Sensing Intrinsic Amplitude: 0.8 mV
Lead Channel Sensing Intrinsic Amplitude: 5.4 mV
Lead Channel Setting Pacing Amplitude: 2.5 V
Lead Channel Setting Pacing Pulse Width: 0.5 ms
Pulse Gen Model: 2272
Pulse Gen Serial Number: 3315946

## 2024-08-12 LAB — CBC
HCT: 37.3 % — ABNORMAL LOW (ref 39.0–52.0)
Hemoglobin: 12.4 g/dL — ABNORMAL LOW (ref 13.0–17.0)
MCH: 32.8 pg (ref 26.0–34.0)
MCHC: 33.2 g/dL (ref 30.0–36.0)
MCV: 98.7 fL (ref 80.0–100.0)
Platelets: 173 K/uL (ref 150–400)
RBC: 3.78 MIL/uL — ABNORMAL LOW (ref 4.22–5.81)
RDW: 13.9 % (ref 11.5–15.5)
WBC: 7.5 K/uL (ref 4.0–10.5)
nRBC: 0 % (ref 0.0–0.2)

## 2024-08-12 LAB — TROPONIN T, HIGH SENSITIVITY
Troponin T High Sensitivity: 36 ng/L — ABNORMAL HIGH (ref 0–19)
Troponin T High Sensitivity: 33 ng/L — ABNORMAL HIGH (ref 0–19)

## 2024-08-12 LAB — PROTIME-INR
INR: 1.2 (ref 0.8–1.2)
Prothrombin Time: 15.4 s — ABNORMAL HIGH (ref 11.4–15.2)

## 2024-08-12 NOTE — Patient Instructions (Signed)
 YOU HAVE BEEN RECOMMENDED TO GO TO THE EMERGENCY DEPARTMENT

## 2024-08-12 NOTE — ED Provider Notes (Signed)
 " [facility] Provider Note   CSN: [id number] Arrival date & time: 08/12/24  1648     Patient presents with: Pacemaker Problem   Joseph Austin is a 83 y.o. male who has known history of hyperlipidemia, hypertension, A-fib, complete heart block status post Abbott dual-chamber PPM implanted in 2011 who is seen in the clinic today with EP for routine visit.  Was found to have malfunction of his pacemaker, does not capture with any physical movement of the PPM pocket on the chest wall.  Given patient is pacer dependent with complete heart block was directed to the ED for admission.  He is chest pain-free, denies any shortness of breath palpitations or syncope.  Typically anticoagulated on Eliquis  which cardiology recommended to be held during admission in favor of heparin . Likely revision this week inpatient.    HPI     Prior to Admission medications  Medication Sig Start Date End Date Taking? Authorizing Provider  acetaminophen  (TYLENOL ) 325 MG tablet Take 325 mg by mouth every 6 (six) hours as needed for moderate pain (pain score 4-6) or headache.    [provider]  allopurinol  (ZYLOPRIM ) 300 MG tablet Take 300 mg by mouth daily.    [provider]  carvedilol  (COREG ) 25 MG tablet Take 1.5 tablets (37.5 mg total) by mouth 2 (two) times daily with a meal. 09/25/23   [name], MD  dorzolamide -timolol  (COSOPT ) 22.3-6.8 MG/ML ophthalmic solution Place 1 drop into both eyes 2 (two) times daily.    [provider]  ELIQUIS  5 MG TABS tablet TAKE 1 TABLET(5 MG) BY MOUTH TWICE DAILY 02/23/24   [name], MD  hydrochlorothiazide  (HYDRODIURIL ) 12.5 MG tablet Take 12.5 mg by mouth daily.    [provider]  hydrochlorothiazide  (HYDRODIURIL ) 25 MG tablet TAKE 1 TABLET(25 MG) BY MOUTH DAILY 08/10/23   [name], MD  JANUMET 50-1000 MG tablet Take 1 tablet by mouth 2 (two) times daily. 02/15/21   [provider]  rosuvastatin  (CRESTOR ) 20 MG tablet Take 20 mg by mouth daily. 10/05/19   [provider]    Allergies: Patient has no known allergies.    Review of Systems  Constitutional: Negative.   HENT: Negative.    Respiratory: Negative.    Cardiovascular: Negative.   Gastrointestinal: Negative.     Updated Vital Signs BP (!) 157/82   Pulse 60   Temp 98.1 F (36.7 C)   Resp 16   Ht 5' 8 (1.727 m)   Wt 88.9 kg   SpO2 100%   BMI 29.80 kg/m   Physical Exam Vitals and nursing note reviewed.  Constitutional:      Appearance: He is not ill-appearing or toxic-appearing.  HENT:     Head: Normocephalic and atraumatic.     Mouth/Throat:     Mouth: Mucous membranes are moist.     Pharynx: No oropharyngeal exudate or posterior oropharyngeal erythema.  Eyes:     General:        Right eye: No discharge.        Left eye: No discharge.     Conjunctiva/sclera: Conjunctivae normal.  Cardiovascular:     Rate and Rhythm: Normal rate and regular rhythm.     Pulses: Normal pulses.     Heart sounds: Normal heart sounds. No murmur heard. Pulmonary:     Effort: Pulmonary effort is normal. No respiratory distress.     Breath sounds: Normal breath sounds. No  wheezing or rales.  Chest:    Abdominal:     General: Bowel sounds are normal. There is no distension.     Palpations: Abdomen is soft.     Tenderness: There is no abdominal tenderness.  Musculoskeletal:        General: No deformity.     Cervical back: Neck supple.  Skin:    General: Skin is warm and dry.     Capillary Refill: Capillary refill takes less than 2 seconds.  Neurological:     General: No focal deficit present.     Mental Status: He is alert and oriented to person, place, and time. Mental status is at baseline.  Psychiatric:        Mood and Affect: Mood normal.     (all labs ordered are listed, but only abnormal results are displayed) Labs Reviewed  BASIC METABOLIC PANEL WITH GFR - Abnormal;  Notable for the following components:      Result Value   Sodium 134 (*)    CO2 21 (*)    Glucose, Bld 114 (*)    All other components within normal limits  CBC - Abnormal; Notable for the following components:   RBC 3.78 (*)    Hemoglobin 12.4 (*)    HCT 37.3 (*)    All other components within normal limits  PROTIME-INR - Abnormal; Notable for the following components:   Prothrombin Time 15.4 (*)    All other components within normal limits  TROPONIN T, HIGH SENSITIVITY - Abnormal; Notable for the following components:   Troponin T High Sensitivity 36 (*)    All other components within normal limits  TROPONIN T, HIGH SENSITIVITY - Abnormal; Notable for the following components:   Troponin T High Sensitivity 33 (*)    All other components within normal limits    EKG: EKG Interpretation Date/Time:  Friday August 12 2024 17:01:03 EST Ventricular Rate:  60 PR Interval:    QRS Duration:  170 QT Interval:  508 QTC Calculation: 508 R Axis:   261  Text Interpretation: Ventricular-paced rhythm Abnormal ECG Confirmed by Griselda Norris [id number]) on 08/12/2024 11:35:02 PM  Radiology: [facility] Chest 2 View Result Date: 08/12/2024 EXAM: 2 VIEW(S) XRAY OF THE CHEST 08/12/2024 06:04:00 PM COMPARISON: None available. CLINICAL HISTORY: pacemaker problem FINDINGS: LINES, TUBES AND DEVICES: Left chest dual lead pacing system with wire tips in the region of the right atrium and right ventricle. Additional disconnected pacemaker lead. LUNGS AND PLEURA: No focal pulmonary opacity. No pleural effusion. No pneumothorax. HEART AND MEDIASTINUM: Mild cardiomegaly. Calcification of transverse aorta. BONES AND SOFT TISSUES: Thoracic spondylosis. No acute osseous abnormality. IMPRESSION: 1. Left chest dual lead pacing system with wire tips projecting in the region of the right atrium and right ventricle, with an additional disconnected pacemaker lead. 2. Mild cardiomegaly. Electronically signed by: Dorethia Molt  MD 08/12/2024 07:16 PM EST RP Workstation: [id number]   CUP PACEART INCLINIC DEVICE CHECK Result Date: 08/12/2024 in-clinic _dual__ chamber pacemaker check. Presenting Rhythm: __AFlutter/VP_ . Routine testing of thresholds, sensing, and impedance demonstrate stable parameters (appears chronically lower impedances on both leads, slight trending downwards). 100% AFlutter. Estimated longevity _3 years___ . Pt enrolled in remote follow-up. NOISE noted on both leads (known for him) TODAY is reproducible on RV lead that inhibits pacing programmed VOO see OV note for full discussion RU    Procedures   Medications Ordered in the ED - No data to display  Clinical Course as of 08/13/24 0344  Sat Aug 13, 2024  0011 Consult call received from Cardiology fellow Dr. Otelia, who is amenable to admitting the patient to his service, will present to the bedside to evaluate the patient in ED. I appreciate his collaboration in the care of this patient.  [RS]    Clinical Course User Index [RS] Susannah Carbin, Pleasant SAUNDERS, PA-C                                 Medical Decision Making 83 year old male presents pacemaker problem  Hypertensive on intake, vital signs otherwise normal.  Cardiopulmonary exam unremarkable, abdominal exam is benign.  CBC  Amount and/or Complexity of Data Reviewed Labs: ordered.    Details: CBC with very mild anemia with hemoglobin of 12.4, BMP reassuring.  INR normal, troponin very mildly elevated to 36, downtrending to 33.  Radiology: ordered.    Details:  Chest x-ray with left pacer, disconnected pacer lead present.  Risk Decision regarding hospitalization.     Cardiology consulted as above who presented to the bedside and evaluated patient and admitted to cardiology service.  Appreciate collaboration of care with patient.  Patient will require admission to the hospital for pacer management. Mccauley and his family  voiced understanding of her medical evaluation and treatment  plan. Each of their questions answered to their expressed satisfaction.   This chart was dictated using voice recognition software, Dragon. Despite the best efforts of this provider to proofread and correct errors, errors may still occur which can change documentation meaning.      Final diagnoses:  None    ED Discharge Orders     None          Bobette Pleasant SAUNDERS DEVONNA 08/13/24 [id number]  "

## 2024-08-12 NOTE — ED Triage Notes (Signed)
 Cardiology sent over due to wire on pacemaker not working properly. Pt denies any pain or discomfort. Pt was at cardiology for a regular follow up visit.

## 2024-08-12 NOTE — ED Triage Notes (Signed)
 Pt sent here from heartcare due to pacemaker malfunction. Pt has no complaints.

## 2024-08-12 NOTE — H&P (Addendum)
 "  Cardiology Admission History and Physical   Patient ID: Joseph Austin MRN: [id number]; DOB: 18-Nov-1940   Admission date: 08/12/2024  PCP:  Joseph Murray HERO., MD   [facility] Providers Cardiologist:  Joseph Birmingham, MD  Electrophysiologist:  Joseph Birmingham, MD      Chief Complaint:  device malfunction   Patient Profile: Joseph Austin is a 83 y.o. male with HTN, HLD, AF, CHB s/p Abbott dual-chamber PPM (implanted 2011; gen change 2020) who is being seen 08/12/2024 for the evaluation of PPM malfunction.  History of Present Illness: Joseph Austin came in for a scheduled EP clinic visit; during this visit, pocket manipulation caused noise, inhibiting V pacing.  As he is pacemaker dependent with underlying CHB, he was sent to the ED for admission for consideration of lead revision next week.  He reports no symptoms. Specifically, no SOB, chest pain, lightheadedness, palpitations, pre-syncope, syncope, fever, chills, discharge from the pocket site or pain at the pocket site.  CXR showed no acute findings and leads with expected course and location.    Past Medical History:  Diagnosis Date   Arthritis    Blockage of coronary artery of heart (HCC)    Complete heart block (HCC)    Depression    Gastritis    Hx of    Glaucoma    History of heart attack 1992   HTN (hypertension)    Pacemaker    Syncope    Unspecified essential hypertension    Past Surgical History:  Procedure Laterality Date   PACEMAKER INSERTION     PPM GENERATOR CHANGEOUT N/A 02/16/2019   Procedure: PPM GENERATOR CHANGEOUT;  Surgeon: Austin Joseph ORN, MD;  Location: [facility] INVASIVE [facility];  Service: Cardiovascular;  Laterality: N/A;     Medications Prior to Admission: Prior to Admission medications  Medication Sig Start Date End Date Taking? Authorizing Provider  acetaminophen  (TYLENOL ) 325 MG tablet Take 325 mg by mouth every 6 (six) hours as needed for moderate pain (pain score 4-6) or  headache.    [provider]  allopurinol  (ZYLOPRIM ) 300 MG tablet Take 300 mg by mouth daily.    [provider]  carvedilol  (COREG ) 25 MG tablet Take 1.5 tablets (37.5 mg total) by mouth 2 (two) times daily with a meal. 09/25/23   Austin Joseph ORN, MD  dorzolamide -timolol  (COSOPT ) 22.3-6.8 MG/ML ophthalmic solution Place 1 drop into both eyes 2 (two) times daily.    [provider]  ELIQUIS  5 MG TABS tablet TAKE 1 TABLET(5 MG) BY MOUTH TWICE DAILY 02/23/24   Austin Joseph ORN, MD  hydrochlorothiazide  (HYDRODIURIL ) 12.5 MG tablet Take 12.5 mg by mouth daily.    [provider]  hydrochlorothiazide  (HYDRODIURIL ) 25 MG tablet TAKE 1 TABLET(25 MG) BY MOUTH DAILY 08/10/23   Austin Joseph ORN, MD  JANUMET 50-1000 MG tablet Take 1 tablet by mouth 2 (two) times daily. 02/15/21   [provider]  rosuvastatin  (CRESTOR ) 20 MG tablet Take 20 mg by mouth daily. 10/05/19   [provider]     Allergies:   Allergies[1]  Social History:   Social History   Socioeconomic History   Marital status: Widowed    Spouse name: Not on file   Number of children: 1   Years of education: Not on file   Highest education level: Not on file  Occupational History   Not on file  Tobacco Use   Smoking status: Every Day   Smokeless tobacco: Never  Substance and Sexual  Activity   Alcohol use: Yes    Comment: Little usage   Drug use: No   Sexual activity: Yes    Birth control/protection: Condom  Other Topics Concern   Not on file  Social History Narrative   Not on file   Social Drivers of Health   Tobacco Use: Medium Risk (06/09/2024)   Received from [facility]   Patient History    Smoking Tobacco Use: Former    Smokeless Tobacco Use: Never    Passive Exposure: Not on Actuary Strain: Not on file  Food Insecurity: Not on file  Transportation Needs: Not on file  Physical Activity: Not on file  Stress: Not on file  Social Connections: Not on  file  Intimate Partner Violence: Not on file  Depression (EYV7-0): Not on file  Alcohol Screen: Not on file  Housing: Not on file  Utilities: Not on file  Health Literacy: Not on file     Family History:   The patient's family history includes Diabetes in his mother; Lung cancer in his father. There is no history of Colon cancer.    ROS:  Please see the history of present illness.  All other ROS reviewed and negative.     Physical Exam/Data: Vitals:   08/12/24 1656 08/12/24 1658 08/12/24 2314  BP: (!) 157/71  (!) 157/82  Pulse: (!) 59  60  Resp: 18  16  Temp: 98.2 F (36.8 C)  98.1 F (36.7 C)  TempSrc: Oral    SpO2: 100%  100%  Weight:  88.9 kg   Height:  5' 8 (1.727 m)    No intake or output data in the 24 hours ending 08/12/24 2356    08/12/2024    4:58 PM 08/12/2024    3:14 PM 10/16/2023    8:19 AM  Last 3 Weights  Weight (lbs) 195 lb 15.8 oz 196 lb 181 lb 14.1 oz  Weight (kg) 88.9 kg 88.905 kg 82.5 kg     Body mass index is 29.8 kg/m.  General:  Well nourished, well developed, in no acute distress HEENT: normal Neck: no JVD Vascular: Distal pulses 2+ bilaterally   Cardiac:  normal S1, S2; RRR; no murmur  Lungs:  clear to auscultation bilaterally, no wheezing, rhonchi or rales  Abd: soft, nontender, no hepatomegaly  Ext: no edema Musculoskeletal:  No deformities, BUE and BLE strength normal and equal Skin: warm and dry  Neuro:  CNs 2-12 intact, no focal abnormalities noted Psych:  Normal affect   EKG:  The ECG that was done on 08/12/2024  was personally reviewed and demonstrates V-paced rhythm    Relevant CV Studies:  PPM interrogation 08/12/2024: in-clinic _dual__ chamber pacemaker check. Presenting Rhythm: __AFlutter/VP_ . Routine testing of thresholds, sensing, and impedance demonstrate stable parameters (appears chronically lower impedances on both leads, slight trending downwards). 100%  AFlutter. Estimated longevity _3 years___ . Pt enrolled in  remote follow-up.   NOISE noted on both leads (known for him)  TODAY is reproducible on RV lead that inhibits pacing  programmed VOO  see OV note for full discussion   Laboratory Data: High Sensitivity Troponin:  No results for input(s): TROPONINIHS in the last 720 hours.  Recent Labs  Lab 08/12/24 1659 08/12/24 2127  TRNPT 36* 33*        Chemistry Recent Labs  Lab 08/12/24 1659  NA 134*  K 4.6  CL 102  CO2 21*  GLUCOSE 114*  BUN 23  CREATININE 0.88  CALCIUM  9.8  GFRNONAA >60  ANIONGAP 11    No results for input(s): PROT, ALBUMIN, AST, ALT, ALKPHOS, BILITOT in the last 168 hours. Lipids No results for input(s): CHOL, TRIG, HDL, LABVLDL, LDLCALC, CHOLHDL in the last 168 hours. Hematology Recent Labs  Lab 08/12/24 1659  WBC 7.5  RBC 3.78*  HGB 12.4*  HCT 37.3*  MCV 98.7  MCH 32.8  MCHC 33.2  RDW 13.9  PLT 173   Thyroid  No results for input(s): TSH, FREET4 in the last 168 hours. BNPNo results for input(s): BNP, PROBNP in the last 168 hours.  DDimer No results for input(s): DDIMER in the last 168 hours.  Radiology/Studies:  DG Chest 2 View Result Date: 08/12/2024 EXAM: 2 VIEW(S) XRAY OF THE CHEST 08/12/2024 06:04:00 PM COMPARISON: None available. CLINICAL HISTORY: pacemaker problem FINDINGS: LINES, TUBES AND DEVICES: Left chest dual lead pacing system with wire tips in the region of the right atrium and right ventricle. Additional disconnected pacemaker lead. LUNGS AND PLEURA: No focal pulmonary opacity. No pleural effusion. No pneumothorax. HEART AND MEDIASTINUM: Mild cardiomegaly. Calcification of transverse aorta. BONES AND SOFT TISSUES: Thoracic spondylosis. No acute osseous abnormality. IMPRESSION: 1. Left chest dual lead pacing system with wire tips projecting in the region of the right atrium and right ventricle, with an additional disconnected pacemaker lead. 2. Mild cardiomegaly. Electronically signed by: Dorethia Molt MD  08/12/2024 07:16 PM EST RP Workstation: [id number]   CUP PACEART INCLINIC DEVICE CHECK Result Date: 08/12/2024 in-clinic _dual__ chamber pacemaker check. Presenting Rhythm: __AFlutter/VP_ . Routine testing of thresholds, sensing, and impedance demonstrate stable parameters (appears chronically lower impedances on both leads, slight trending downwards). 100% AFlutter. Estimated longevity _3 years___ . Pt enrolled in remote follow-up. NOISE noted on both leads (known for him) TODAY is reproducible on RV lead that inhibits pacing programmed VOO see OV note for full discussion RU    Assessment and Plan: Device malfunction  Manipulation of PPM pocket in EP clinic produced noise on RV lead with pacing inhibition.  - Plan for possible lead revision.  - Hold apixaban ; heparin  gtt - Obtain TTE  2. AF - Hold apixaban  as above; heparin  gtt in the meantime  3. HTN Continue hydrochlorothiazide  12.5 mg daily and coreg  37.5 mg BID  4. HLD Continue rosuvastatin     Risk Assessment/Risk Scores:        CHA2DS2-VASc Score = 4   This indicates a 4.8% annual risk of stroke. The patient's score is based upon: CHF History: 0 HTN History: 1 Diabetes History: 1 Stroke History: 0 Vascular Disease History: 0 Age Score: 2 Gender Score: 0    Code Status: Full Code  Severity of Illness: The appropriate patient status for this patient is INPATIENT. Inpatient status is judged to be reasonable and necessary in order to provide the required intensity of service to ensure the patient's safety. The patient's presenting symptoms, physical exam findings, and initial radiographic and laboratory data in the context of their chronic comorbidities is felt to place them at high risk for further clinical deterioration. Furthermore, it is not anticipated that the patient will be medically stable for discharge from the hospital within 2 midnights of admission.   * I certify that at the point of admission it is my  clinical judgment that the patient will require inpatient hospital care spanning beyond 2 midnights from the point of admission due to high intensity of service, high risk for further deterioration and high frequency of surveillance required.*  For questions or updates,  please contact [facility] Please consult [email or web address] for contact info under       Signed, Gillian CHRISTELLA Cass, MD  08/12/2024 11:56 PM       [1] No Known Allergies  "

## 2024-08-13 DIAGNOSIS — Z7984 Long term (current) use of oral hypoglycemic drugs: Secondary | ICD-10-CM | POA: Diagnosis not present

## 2024-08-13 DIAGNOSIS — T82110A Breakdown (mechanical) of cardiac electrode, initial encounter: Secondary | ICD-10-CM | POA: Diagnosis present

## 2024-08-13 DIAGNOSIS — T82110D Breakdown (mechanical) of cardiac electrode, subsequent encounter: Secondary | ICD-10-CM | POA: Diagnosis not present

## 2024-08-13 DIAGNOSIS — Y712 Prosthetic and other implants, materials and accessory cardiovascular devices associated with adverse incidents: Secondary | ICD-10-CM | POA: Diagnosis present

## 2024-08-13 DIAGNOSIS — Z833 Family history of diabetes mellitus: Secondary | ICD-10-CM | POA: Diagnosis not present

## 2024-08-13 DIAGNOSIS — Z950 Presence of cardiac pacemaker: Secondary | ICD-10-CM | POA: Diagnosis not present

## 2024-08-13 DIAGNOSIS — I252 Old myocardial infarction: Secondary | ICD-10-CM | POA: Diagnosis not present

## 2024-08-13 DIAGNOSIS — I4821 Permanent atrial fibrillation: Secondary | ICD-10-CM | POA: Diagnosis present

## 2024-08-13 DIAGNOSIS — E785 Hyperlipidemia, unspecified: Secondary | ICD-10-CM | POA: Diagnosis present

## 2024-08-13 DIAGNOSIS — T82111A Breakdown (mechanical) of cardiac pulse generator (battery), initial encounter: Secondary | ICD-10-CM | POA: Diagnosis present

## 2024-08-13 DIAGNOSIS — I442 Atrioventricular block, complete: Secondary | ICD-10-CM | POA: Diagnosis present

## 2024-08-13 DIAGNOSIS — Z79899 Other long term (current) drug therapy: Secondary | ICD-10-CM | POA: Diagnosis not present

## 2024-08-13 DIAGNOSIS — I10 Essential (primary) hypertension: Secondary | ICD-10-CM | POA: Diagnosis present

## 2024-08-13 DIAGNOSIS — Z7901 Long term (current) use of anticoagulants: Secondary | ICD-10-CM | POA: Diagnosis not present

## 2024-08-13 DIAGNOSIS — Z87891 Personal history of nicotine dependence: Secondary | ICD-10-CM | POA: Diagnosis not present

## 2024-08-13 LAB — HEPARIN LEVEL (UNFRACTIONATED)
Heparin Unfractionated: 0.58 [IU]/mL (ref 0.30–0.70)
Heparin Unfractionated: 0.62 [IU]/mL (ref 0.30–0.70)

## 2024-08-13 LAB — APTT
aPTT: 54 s — ABNORMAL HIGH (ref 24–36)
aPTT: 89 s — ABNORMAL HIGH (ref 24–36)

## 2024-08-13 MED ORDER — ACETAMINOPHEN 325 MG PO TABS
650.0000 mg | ORAL_TABLET | ORAL | Status: DC | PRN
  Administered 2024-08-13 – 2024-08-14 (×2): 650 mg via ORAL
  Filled 2024-08-13 (×2): qty 2

## 2024-08-13 MED ORDER — ONDANSETRON HCL 4 MG/2ML IJ SOLN: 4.0000 mg | Freq: Four times a day (QID) | INTRAMUSCULAR | Status: DC | PRN

## 2024-08-13 MED ORDER — ALLOPURINOL 300 MG PO TABS
300.0000 mg | ORAL_TABLET | Freq: Every day | ORAL | Status: DC
  Administered 2024-08-13 – 2024-08-14 (×2): 300 mg via ORAL
  Filled 2024-08-13: qty 1
  Filled 2024-08-13: qty 3

## 2024-08-13 MED ORDER — DORZOLAMIDE HCL-TIMOLOL MAL 2-0.5 % OP SOLN
1.0000 [drp] | Freq: Two times a day (BID) | OPHTHALMIC | Status: DC
  Administered 2024-08-13: 1 [drp] via OPHTHALMIC
  Filled 2024-08-13: qty 10

## 2024-08-13 MED ORDER — HEPARIN (PORCINE) 25000 UT/250ML-% IV SOLN
1250.0000 [IU]/h | INTRAVENOUS | Status: DC
  Administered 2024-08-13: 1100 [IU]/h via INTRAVENOUS
  Administered 2024-08-13: 1250 [IU]/h via INTRAVENOUS
  Filled 2024-08-13 (×2): qty 250

## 2024-08-13 MED ORDER — ROSUVASTATIN CALCIUM 20 MG PO TABS
20.0000 mg | ORAL_TABLET | Freq: Every day | ORAL | Status: DC
  Administered 2024-08-13 – 2024-08-14 (×2): 20 mg via ORAL
  Filled 2024-08-13 (×2): qty 1

## 2024-08-13 MED ORDER — CARVEDILOL 25 MG PO TABS
37.5000 mg | ORAL_TABLET | Freq: Two times a day (BID) | ORAL | Status: DC
  Administered 2024-08-13 – 2024-08-14 (×3): 37.5 mg via ORAL
  Filled 2024-08-13: qty 1
  Filled 2024-08-13: qty 3
  Filled 2024-08-13: qty 1

## 2024-08-13 NOTE — Progress Notes (Signed)
 PHARMACY - ANTICOAGULATION CONSULT NOTE  Pharmacy Consult for Heparin  (Apixaban  on hold) Indication: atrial fibrillation  Allergies[1]  Patient Measurements: Height: 5' 8 (172.7 cm) Weight: 88.9 kg (195 lb 15.8 oz) IBW/kg (Calculated) : 68.4 HEPARIN  DW (KG): 86.5  Vital Signs: Temp: 97.8 F (36.6 C) (12/27 0930) Temp Source: Oral (12/27 0930) BP: 132/65 (12/27 0900) Pulse Rate: 60 (12/27 0900)  Labs: Recent Labs    08/12/24 1659 08/13/24 1000  HGB 12.4*  --   HCT 37.3*  --   PLT 173  --   APTT  --  54*  LABPROT 15.4*  --   INR 1.2  --   HEPARINUNFRC  --  0.58  CREATININE 0.88  --     Estimated Creatinine Clearance: 71.3 mL/min (by C-G formula based on SCr of 0.88 mg/dL).   Medical History: Past Medical History:  Diagnosis Date   Arthritis    Blockage of coronary artery of heart (HCC)    Complete heart block (HCC)    Depression    Gastritis    Hx of    Glaucoma    History of heart attack 1992   HTN (hypertension)    Pacemaker    Syncope    Unspecified essential hypertension     Assessment: 83 y/o M with PPM malfunction, on apixaban  PTA for afib. Holding apixaban  and starting heparin  in case procedures are needed. It has been >12 hours since last apixaban  dose, ok to start heparin  now. Anticipate using aPTT to dose for now.   Initial aPTT subtherapeutic at 54 seconds, Anti-Xa level showing within therapeutic range however likely still falsely elevated from last apixaban  dose so will continue to follow aPTT until correlated   Goal of Therapy:  Heparin  level 0.3-0.7 units/ml aPTT 66-102 seconds Monitor platelets by anticoagulation protocol: Yes   Plan:  Increase heparin  gtt to 1250 units/hr F/u 8 hour aPTT/HL  Dorn Poot, PharmD, [facility] Clinical Pharmacist ED Pharmacist Phone # [phone] 08/13/2024 10:59 AM         [1] No Known Allergies

## 2024-08-13 NOTE — Progress Notes (Signed)
 PHARMACY - ANTICOAGULATION CONSULT NOTE  Pharmacy Consult for Heparin  (Apixaban  on hold) Indication: atrial fibrillation  Allergies[1]  Patient Measurements: Height: 5' 8 (172.7 cm) Weight: 88.9 kg (195 lb 15.8 oz) IBW/kg (Calculated) : 68.4 HEPARIN  DW (KG): 86.5  Vital Signs: Temp: 98.1 F (36.7 C) (12/26 2314) Temp Source: Oral (12/26 1656) BP: 141/71 (12/27 0100) Pulse Rate: 59 (12/27 0100)  Labs: Recent Labs    08/12/24 1659  HGB 12.4*  HCT 37.3*  PLT 173  LABPROT 15.4*  INR 1.2  CREATININE 0.88    Estimated Creatinine Clearance: 71.3 mL/min (by C-G formula based on SCr of 0.88 mg/dL).   Medical History: Past Medical History:  Diagnosis Date   Arthritis    Blockage of coronary artery of heart (HCC)    Complete heart block (HCC)    Depression    Gastritis    Hx of    Glaucoma    History of heart attack 1992   HTN (hypertension)    Pacemaker    Syncope    Unspecified essential hypertension     Assessment: 83 y/o M with PPM malfunction, on apixaban  PTA for afib. Holding apixaban  and starting heparin  in case procedures are needed. It has been >12 hours since last apixaban  dose, ok to start heparin  now. Anticipate using aPTT to dose for now.   Goal of Therapy:  Heparin  level 0.3-0.7 units/ml aPTT 66-102 seconds Monitor platelets by anticoagulation protocol: Yes   Plan:  Start heparin  at 1100 units/hr Heparin  level and aPTT in 8 hours  Lynwood Mckusick, PharmD, BCPS Clinical Pharmacist Phone: [phone]       [1] No Known Allergies

## 2024-08-13 NOTE — Plan of Care (Signed)

## 2024-08-13 NOTE — Progress Notes (Signed)
 PHARMACY - ANTICOAGULATION CONSULT NOTE  Pharmacy Consult for Heparin  (Apixaban  on hold) Indication: atrial fibrillation  Allergies[1]  Patient Measurements: Height: 5' 8 (172.7 cm) Weight: 88.9 kg (195 lb 15.8 oz) IBW/kg (Calculated) : 68.4 HEPARIN  DW (KG): 86.5  Vital Signs: Temp: 98.2 F (36.8 C) (12/27 1935) Temp Source: Oral (12/27 1935) BP: 108/67 (12/27 1935) Pulse Rate: 60 (12/27 1935)  Labs: Recent Labs    08/12/24 1659 08/13/24 1000 08/13/24 2002  HGB 12.4*  --   --   HCT 37.3*  --   --   PLT 173  --   --   APTT  --  54* 89*  LABPROT 15.4*  --   --   INR 1.2  --   --   HEPARINUNFRC  --  0.58 0.62  CREATININE 0.88  --   --     Estimated Creatinine Clearance: 71.3 mL/min (by C-G formula based on SCr of 0.88 mg/dL).   Medical History: Past Medical History:  Diagnosis Date   Arthritis    Blockage of coronary artery of heart (HCC)    Complete heart block (HCC)    Depression    Gastritis    Hx of    Glaucoma    History of heart attack 1992   HTN (hypertension)    Pacemaker    Syncope    Unspecified essential hypertension     Assessment: 83 y/o M with PPM malfunction, on apixaban  PTA for afib. Holding apixaban  and starting heparin  in case procedures are needed. It has been >12 hours since last apixaban  dose, ok to start heparin  now. Anticipate using aPTT to dose for now.   Heparin  level is therapeutic at 0.62, aPTT also is therapeutic at 89, on 1250 units/hr - correlating so will just monitor heparin  level. No s/sx of bleeding or infusion issues.   Goal of Therapy:  Heparin  level 0.3-0.7 units/ml aPTT 66-102 seconds Monitor platelets by anticoagulation protocol: Yes   Plan:  Continue heparin  gtt at 1250 units/hr Monitor heparin  level in 8 hours with AM labs Follow daily HL, CBC, and for s/sx of bleeding   Thank you for allowing pharmacy to participate in this patient's care,  Suzen Sour, PharmD, BCCCP Clinical Pharmacist  Phone:  [phone] 08/13/2024 8:37 PM  Please check AMION for all [facility] Pharmacy phone numbers After 10:00 PM, call Main Pharmacy [phone]      [1] No Known Allergies

## 2024-08-13 NOTE — Consult Note (Signed)
 " Cardiology Consultation:   Patient ID: Joseph Austin MRN: [id number]; DOB: September 13, 1940  Admit date: 08/12/2024 Date of Consult: 08/13/2024  Primary Care Provider: Delilah Murray HERO., MD [facility] Cardiologist: Danelle Birmingham, MD  [facility] Electrophysiologist:  Danelle Birmingham, MD   Patient Profile:   Joseph Austin is a 83 y.o. male with CHB s/p Abbott DC PPM, capped/abandoned Rvp lead, and HTN who presents for evaluation of both RA and RV lead noise which is reproducible with pocket stimulation and resulting in pacing inhibition.  History of Present Illness:   Joseph Austin initially had his device placed in [place] in 1992 and at that time it was a single-chamber RV PPM.  He had subsequent elevation and pacing thresholds and underwent insertion of new RA and RV pacing leads in 2011.  He had device replacement on 02/16/2019.  In 2022 there was some intermittent RA lead noise and more recently intermittent RV lead noise has been observed on device interrogations.  On 05/27/2023 he had simultaneous VT and a lead noise and then noise reversion occurred.  The RV lead sensitivity was adjusted up to 5.0 since he is device dependent.  He was seen back in December and had 2 brief episodes of noise on both leads but was unable to reproduce RV lead noise.  No obvious external sources.  There was noise on both the AMB leads yesterday which was reproducible with pocket manipulation that inhibited pacing.  He was programmed to VOO 60 and directed for admission for further evaluation.   Past Medical History:  Diagnosis Date   Arthritis    Blockage of coronary artery of heart (HCC)    Complete heart block (HCC)    Depression    Gastritis    Hx of    Glaucoma    History of heart attack 1992   HTN (hypertension)    Pacemaker    Syncope    Unspecified essential hypertension    Past Surgical History:  Procedure Laterality Date   PACEMAKER INSERTION     PPM GENERATOR CHANGEOUT N/A  02/16/2019   Procedure: PPM GENERATOR CHANGEOUT;  Surgeon: Birmingham Danelle ORN, MD;  Location: [facility] INVASIVE CV LAB;  Service: Cardiovascular;  Laterality: N/A;    Inpatient Medications: Scheduled Meds:  allopurinol   300 mg Oral Daily   carvedilol   37.5 mg Oral BID WC   dorzolamide -timolol   1 drop Both Eyes BID   rosuvastatin   20 mg Oral Daily   Continuous Infusions:  heparin  1,250 Units/hr (08/13/24 1600)   PRN Meds: acetaminophen , ondansetron  (ZOFRAN ) IV  Allergies:   Allergies[1]  Social History:   Social History   Socioeconomic History   Marital status: Widowed    Spouse name: Not on file   Number of children: 1   Years of education: Not on file   Highest education level: Not on file  Occupational History   Not on file  Tobacco Use   Smoking status: Every Day   Smokeless tobacco: Never  Substance and Sexual Activity   Alcohol use: Yes    Comment: Little usage   Drug use: No   Sexual activity: Yes    Birth control/protection: Condom  Other Topics Concern   Not on file  Social History Narrative   Not on file   Social Drivers of Health   Tobacco Use: Medium Risk (06/09/2024)   Received from [facility]   Patient History    Smoking Tobacco Use: Former    Smokeless Tobacco Use: Never  Passive Exposure: Not on file  Financial Resource Strain: Not on file  Food Insecurity: No Food Insecurity (08/13/2024)   Epic    Worried About Programme Researcher, Broadcasting/film/video in the Last Year: Never true    Ran Out of Food in the Last Year: Never true  Transportation Needs: No Transportation Needs (08/13/2024)   Epic    Lack of Transportation (Medical): No    Lack of Transportation (Non-Medical): No  Physical Activity: Not on file  Stress: Not on file  Social Connections: Unknown (08/13/2024)   Social Connection and Isolation Panel    Frequency of Communication with Friends and Family: More than three times a week    Frequency of Social Gatherings with Friends and Family: More than three  times a week    Attends Religious Services: 1 to 4 times per year    Active Member of [employer] or Organizations: Yes    Attends Banker Meetings: 1 to 4 times per year    Marital Status: Patient declined  Intimate Partner Violence: Not At Risk (08/13/2024)   Epic    Fear of Current or Ex-Partner: No    Emotionally Abused: No    Physically Abused: No    Sexually Abused: No  Depression (PHQ2-9): Not on file  Alcohol Screen: Not on file  Housing: Low Risk (08/13/2024)   Epic    Unable to Pay for Housing in the Last Year: No    Number of Times Moved in the Last Year: 0    Homeless in the Last Year: No  Utilities: Not At Risk (08/13/2024)   Epic    Threatened with loss of utilities: No  Health Literacy: Not on file    Family History:    Family History  Problem Relation Age of Onset   Diabetes Mother    Lung cancer Father    Colon cancer Neg Hx     ROS:  Review of Systems: [y] = yes, [ ]  = no      General: Weight gain [ ] ; Weight loss [ ] ; Anorexia [ ] ; Fatigue [ ] ; Fever [ ] ; Chills [ ] ; Weakness [ ]    Cardiac: Chest pain/pressure [ ] ; Resting SOB [ ] ; Exertional SOB [ ] ; Orthopnea [ ] ; Pedal Edema [ ] ; Palpitations [ ] ; Syncope [ ] ; Presyncope [ ] ; Paroxysmal nocturnal dyspnea [ ]    Pulmonary: Cough [ ] ; Wheezing [ ] ; Hemoptysis [ ] ; Sputum [ ] ; Snoring [ ]    GI: Vomiting [ ] ; Dysphagia [ ] ; Melena [ ] ; Hematochezia [ ] ; Heartburn [ ] ; Abdominal pain [ ] ; Constipation [ ] ; Diarrhea [ ] ; BRBPR [ ]    GU: Hematuria [ ] ; Dysuria [ ] ; Nocturia [ ]  Vascular: Pain in legs with walking [ ] ; Pain in feet with lying flat [ ] ; Non-healing sores [ ] ; Stroke [ ] ; TIA [ ] ; Slurred speech [ ] ;   Neuro: Headaches [ ] ; Vertigo [ ] ; Seizures [ ] ; Paresthesias [ ] ;Blurred vision [ ] ; Diplopia [ ] ; Vision changes [ ]    Ortho/Skin: Arthritis [ ] ; Joint pain [ ] ; Muscle pain [ ] ; Joint swelling [ ] ; Back Pain [ ] ; Rash [ ]    Psych: Depression [ ] ; Anxiety [ ]    Heme: Bleeding problems [ ] ;  Clotting disorders [ ] ; Anemia [ ]    Endocrine: Diabetes [ ] ; Thyroid  dysfunction [ ]    Physical Exam/Data:   Vitals:   08/13/24 1300 08/13/24 1346 08/13/24 1353 08/13/24 1506  BP:  105/62 123/79  117/61  Pulse: (!) 59  (!) 59 60  Resp: 17 (!) 36 20 19  Temp:      TempSrc:      SpO2: 98% 98% 98% 98%  Weight:      Height:       Intake/Output Summary (Last 24 hours) at 08/13/2024 1702 Last data filed at 08/13/2024 1600 Gross per 24 hour  Intake 155.08 ml  Output --  Net 155.08 ml      08/12/2024    4:58 PM 08/12/2024    3:14 PM 10/16/2023    8:19 AM  Last 3 Weights  Weight (lbs) 195 lb 15.8 oz 196 lb 181 lb 14.1 oz  Weight (kg) 88.9 kg 88.905 kg 82.5 kg     Body mass index is 29.8 kg/m.  General:  Well nourished, well developed, in no acute distress HEENT: normal Cardiac:  normal S1, S2; RRR; no murmur  Lungs:  clear to auscultation bilaterally, no wheezing, rhonchi or rales  Abd: soft, nontender, no hepatomegaly  Ext: no edema Musculoskeletal:  No deformities, BUE and BLE strength normal and equal Skin: warm and dry, LEFT infraclavicular incision without hematoma, bleeding  Neuro:  CNs 2-12 intact, no focal abnormalities noted Psych:  Normal affect   EKG: The EKG was personally reviewed and demonstrates: AF/VP 60, QRS 170, QT/c 508/508 Telemetry: Telemetry was personally reviewed and demonstrates: VP 60  Relevant CV Studies: None recent   Laboratory Data:  Chemistry Recent Labs  Lab 08/12/24 1659  NA 134*  K 4.6  CL 102  CO2 21*  GLUCOSE 114*  BUN 23  CREATININE 0.88  CALCIUM  9.8  GFRNONAA >60  ANIONGAP 11    Hematology Recent Labs  Lab 08/12/24 1659  WBC 7.5  RBC 3.78*  HGB 12.4*  HCT 37.3*  MCV 98.7  MCH 32.8  MCHC 33.2  RDW 13.9  PLT 173   Radiology/Studies:  DG Chest 2 View Result Date: 08/12/2024 EXAM: 2 VIEW(S) XRAY OF THE CHEST 08/12/2024 06:04:00 PM COMPARISON: None available. CLINICAL HISTORY: pacemaker problem FINDINGS:  LINES, TUBES AND DEVICES: Left chest dual lead pacing system with wire tips in the region of the right atrium and right ventricle. Additional disconnected pacemaker lead. LUNGS AND PLEURA: No focal pulmonary opacity. No pleural effusion. No pneumothorax. HEART AND MEDIASTINUM: Mild cardiomegaly. Calcification of transverse aorta. BONES AND SOFT TISSUES: Thoracic spondylosis. No acute osseous abnormality. IMPRESSION: 1. Left chest dual lead pacing system with wire tips projecting in the region of the right atrium and right ventricle, with an additional disconnected pacemaker lead. 2. Mild cardiomegaly. Electronically signed by: Dorethia Molt MD 08/12/2024 07:16 PM EST RP Workstation: [id number]   CUP PACEART INCLINIC DEVICE CHECK Result Date: 08/12/2024 in-clinic _dual__ chamber pacemaker check. Presenting Rhythm: __AFlutter/VP_ . Routine testing of thresholds, sensing, and impedance demonstrate stable parameters (appears chronically lower impedances on both leads, slight trending downwards). 100% AFlutter. Estimated longevity _3 years___ . Pt enrolled in remote follow-up. NOISE noted on both leads (known for him) TODAY is reproducible on RV lead that inhibits pacing programmed VOO see OV note for full discussion RU  Device information PPM: SJM 2272 Assurity MRI, SN [id number], DOI 02/16/19  RA: SJM 2088TC Tendril STS, SN [id number], DOI 10/25/09 RV: SJM 7911UR Tendril STS, SN [id number], DOI 10/25/09 Rvp: Teletronics 966-557, SN [id number], DOI 04/26/91, capped/abandoned  Assessment and Plan:  Joseph Austin is a 83 y.o. male with CHB s/p Abbott DC PPM, capped/abandoned Rvp lead, and HTN who presents  for evaluation of both RA and RV lead noise which is reproducible with pocket stimulation and resulting in pacing inhibition.  CHB LEFT Abbott DC PPM Capped/abandoned RA and RV leads Permanent AF/AFL Patient asymptomatic and presents for lead management.  He has evidence of likely insulation breach of  his RA/RV leads leading to lead noise which can be reproduced with pocket stimulation.  This also could be secondary to an issue with a header although I think this is less likely since the lead noise was initially present on the RA lead and then subsequently on the RV lead, now on both intermittently.  We discussed different options for management including continued observation since he is otherwise asymptomatic and device parameters have been stable versus addition of a single ventricular lead versus lead removal and reimplant.  The benefits of continued observation without intervention at his age avoid the risk of infection and at least at this point he has been without any symptoms related to potential lead malfunction.  If we added 1 additional lead he would have a reliable system but have for retained leads.  If we decided to proceed with extraction then I would remove the right atrial and right ventricular leads and replace the device in case there is a header issue.  With any of the above options I would not remove the Teletronics lead from 1992 as the risk of cardiac perforation is not nominal and he does not have any indication for complete hardware removal such as infection.  He is open to proceeding with whichever option we feel would be most reasonable. I have left his device at VOO 60 and increased the output to 5.0 V @ 1 ms overnight. Will assess overnight tonight and discuss further in the morning with him.  He does not have any symptoms related to his atrial arrhythmias so leaving him in permanent AF/AFL at this point.  For questions or updates, please contact [facility] Please consult [email or web address] for contact info under   Signed, Donnice DELENA Primus, MD  08/13/2024 5:02 PM     [1] No Known Allergies  "

## 2024-08-14 DIAGNOSIS — T82110D Breakdown (mechanical) of cardiac electrode, subsequent encounter: Secondary | ICD-10-CM

## 2024-08-14 LAB — CUP PACEART REMOTE DEVICE CHECK
Battery Remaining Longevity: 38 mo
Battery Remaining Percentage: 40 %
Battery Voltage: 2.96 V
Brady Statistic AP VP Percent: 57 %
Brady Statistic AP VS Percent: 0 %
Brady Statistic AS VP Percent: 42 %
Brady Statistic AS VS Percent: 1 %
Brady Statistic RA Percent Paced: 1 %
Brady Statistic RV Percent Paced: 99 %
Date Time Interrogation Session: 20251226020014
Implantable Lead Connection Status: 753985
Implantable Lead Connection Status: 753985
Implantable Lead Implant Date: 20110310
Implantable Lead Implant Date: 20110310
Implantable Lead Location: 753859
Implantable Lead Location: 753860
Implantable Pulse Generator Implant Date: 20200701
Lead Channel Impedance Value: 260 Ohm
Lead Channel Impedance Value: 310 Ohm
Lead Channel Pacing Threshold Amplitude: 0.5 V
Lead Channel Pacing Threshold Amplitude: 0.75 V
Lead Channel Pacing Threshold Pulse Width: 0.5 ms
Lead Channel Pacing Threshold Pulse Width: 0.5 ms
Lead Channel Sensing Intrinsic Amplitude: 1.6 mV
Lead Channel Sensing Intrinsic Amplitude: 5.4 mV
Lead Channel Setting Pacing Amplitude: 2 V
Lead Channel Setting Pacing Amplitude: 2.5 V
Lead Channel Setting Pacing Pulse Width: 0.5 ms
Lead Channel Setting Sensing Sensitivity: 5 mV
Pulse Gen Model: 2272
Pulse Gen Serial Number: 3315946

## 2024-08-14 LAB — CBC
HCT: 35.7 % — ABNORMAL LOW (ref 39.0–52.0)
Hemoglobin: 12.3 g/dL — ABNORMAL LOW (ref 13.0–17.0)
MCH: 32.6 pg (ref 26.0–34.0)
MCHC: 34.5 g/dL (ref 30.0–36.0)
MCV: 94.7 fL (ref 80.0–100.0)
Platelets: 160 K/uL (ref 150–400)
RBC: 3.77 MIL/uL — ABNORMAL LOW (ref 4.22–5.81)
RDW: 13.6 % (ref 11.5–15.5)
WBC: 7.1 K/uL (ref 4.0–10.5)
nRBC: 0 % (ref 0.0–0.2)

## 2024-08-14 LAB — HEPARIN LEVEL (UNFRACTIONATED): Heparin Unfractionated: 0.54 [IU]/mL (ref 0.30–0.70)

## 2024-08-14 MED ORDER — APIXABAN 5 MG PO TABS
5.0000 mg | ORAL_TABLET | Freq: Two times a day (BID) | ORAL | Status: DC
  Administered 2024-08-14: 5 mg via ORAL
  Filled 2024-08-14: qty 2

## 2024-08-14 NOTE — Progress Notes (Signed)
 PHARMACY - ANTICOAGULATION CONSULT NOTE  Pharmacy Consult for Heparin  (Apixaban  on hold) Indication: atrial fibrillation  Allergies[1]  Patient Measurements: Height: 5' 8 (172.7 cm) Weight: 88.9 kg (195 lb 15.8 oz) IBW/kg (Calculated) : 68.4 HEPARIN  DW (KG): 86.5  Vital Signs: Temp: 98.3 F (36.8 C) (12/28 0350) Temp Source: Oral (12/28 0350) BP: 133/83 (12/28 0350) Pulse Rate: 60 (12/28 0350)  Labs: Recent Labs    08/12/24 1659 08/13/24 1000 08/13/24 2002 08/14/24 0335  HGB 12.4*  --   --  12.3*  HCT 37.3*  --   --  35.7*  PLT 173  --   --  160  APTT  --  54* 89*  --   LABPROT 15.4*  --   --   --   INR 1.2  --   --   --   HEPARINUNFRC  --  0.58 0.62 0.54  CREATININE 0.88  --   --   --     Estimated Creatinine Clearance: 71.3 mL/min (by C-G formula based on SCr of 0.88 mg/dL).   Medical History: Past Medical History:  Diagnosis Date   Arthritis    Blockage of coronary artery of heart (HCC)    Complete heart block (HCC)    Depression    Gastritis    Hx of    Glaucoma    History of heart attack 1992   HTN (hypertension)    Pacemaker    Syncope    Unspecified essential hypertension     Assessment: 83 y/o M with PPM malfunction, on apixaban  PTA for afib. Holding apixaban  and starting heparin  in case procedures are needed. It has been >12 hours since last apixaban  dose, ok to start heparin  now.   Heparin  level therapeutic at 0.54 on 1250 units/hr. Hgb 12.3 and PLT WNL. Per RN, no issues with infusion and no signs of bleeding.    Goal of Therapy:  Heparin  level 0.3-0.7 units/ml aPTT 66-102 seconds Monitor platelets by anticoagulation protocol: Yes   Plan:  Continue heparin  at 1250 units/hr Monitor heparin  level in 8 hours and daily while on heparin  Follow daily HL, CBC, and for s/sx of bleeding   Thank you for allowing pharmacy to participate in this patient's care,  Prentice DOROTHA Favors, PharmD PGY1 Health-System Pharmacy Administration and  Leadership Resident [facility]  08/14/2024 7:16 AM        [1] No Known Allergies

## 2024-08-14 NOTE — Progress Notes (Signed)
 Reviewed AVS, patient expressed understanding of medications, MD follow up reviewed.   Removed IV, Site clean, dry and intact.  CCMD contacted and informed patients is being discharged.  Patient states all belongings brought to the hospital at time of admission are accounted for and packed to take home.  Nursing staff contacted to transport patient to entrance A, where family member was waiting in vehicle to transport home.

## 2024-08-14 NOTE — Discharge Summary (Signed)
 " Discharge Summary   Patient ID: Joseph Austin MRN: [id number]; DOB: [birth year]  Admit date: 08/12/2024 Discharge date: 08/14/2024  PCP:  Delilah Murray HERO., MD   [facility] Providers Cardiologist:  Danelle Birmingham, MD  Electrophysiologist:  Danelle Birmingham, MD      Discharge Diagnoses  Principal Problem:   Pacemaker malfunction Active Problems:   Pacemaker lead failure   Diagnostic Studies/Procedures   DEVICE interrogation performed 12/26 in the office and reviewed by Charlies Arthur, PA-C Battery and lead measurements are stable Both appear to have chronically lower impedances,  slight downward trend 100% AMS, AFlutter Rate controlled >99% VP + noise episodes noted on both A and V leads When noise is seen on the V lead, also on A lead Can have A lead noise without noted V lead noise I am able to produce noise on the RV lead today with pocket manipulation that did inhibit pacing He is dependent at 40bpm  Programmed VOO 60 _____________   History of Present Illness: Patient is Spanish-speaking and requires a translator  Joseph Austin is a 83 y.o. male with CHB s/p Abbott DC PPM, capped/abandoned Rvp lead, permanent A-fib, and HTN who came to the hospital on 12/27 after being seen in the office by an EP provider who noted that pocket manipulation easily caused noisiness V lead that was large and inhibited pacing.  He was felt to be pacer dependent.  Therefore, he was sent to the ER.  Hospital Course   Consultants: EP  Although he was noted to have intermittent failure to pace, he was asymptomatic with this.  He denied shortness of breath, chest pain, lightheadedness, presyncope, syncope or palpitations.  The pocket site was intact, without any signs of swelling or infection.  When the pacer was turned down to 30, he was found to have an intrinsic ventricular rhythm of 40.  The A-fib/flutter is felt to be permanent and rhythm control not pursued.  Because  there was concern that he would need a device/lead change, he was started on heparin  and Eliquis  was held.  However, the Eliquis  will be restarted at discharge.  An EP consult was called and he was evaluated by Dr. Almetta.  Parts of his evaluation are reproduced here: Patient asymptomatic and presents for lead management.  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  This also could be secondary to an issue with a header although I think this is less likely since the lead noise was initially present on the RA lead and then subsequently on the RV lead, now on both intermittently.  We discussed different options for management including continued observation since he is otherwise asymptomatic and device parameters have been stable versus addition of a single ventricular lead versus lead removal and reimplant.  The benefits of continued observation without intervention at his age avoid the risk of infection and at least at this point he has been without any symptoms related to potential lead malfunction.  If we added 1 additional lead he would have a reliable system but have for retained leads.  If we decided to proceed with extraction then I would remove the right atrial and right ventricular leads and replace the device in case there is a header issue.  With any of the above options I would not remove the Teletronics lead from 1992 as the risk of cardiac perforation is not nominal and he does not have  any indication for complete hardware removal such as infection.  He is open to proceeding with whichever option we feel would be most reasonable. I have left his device at VOO 60 and increased the output to 5.0 V @ 1 ms overnight. Will assess overnight tonight and discuss further in the morning with him.  He does not have any symptoms related to his atrial arrhythmias so leaving him in permanent AF/AFL at this point.   He  was held overnight and Dr. Almetta reevaluated him on 12/28.  Although he did have intervals where he was not pacing, his intrinsic ventricular rhythm would kick in to keep his heart rate greater than 40.  He was asymptomatic.  At this time, it is felt his best option is watchful waiting.  He is to report symptoms and will be followed closely in the office.   Of note, on med review he has 2 different prescriptions for HCTZ, 1 for 12.5 mg and one for 25 mg.  His pharmacy was contacted and he is taking the 25 mg tablets.  No further inpatient workup is indicated and he is considered stable for discharge, to follow-up as an outpatient.     Did the patient have an acute coronary syndrome (MI, NSTEMI, STEMI, etc) this admission?:  No                               Did the patient have a percutaneous coronary intervention (stent / angioplasty)?:  No.        _____________  Discharge Vitals Blood pressure 126/74, pulse 61, temperature 97.6 F (36.4 C), temperature source Oral, resp. rate 16, height 5' 8 (1.727 m), weight 88.9 kg, SpO2 98%.  Filed Weights   08/12/24 1658  Weight: 88.9 kg  General: Well developed, well nourished, male in no acute distress Head: Eyes PERRLA, Head normocephalic and atraumatic Lungs: clear bilaterally to auscultation. Heart: HR mostly regular, S1 S2, without rub or gallop. No murmur. 4/4 extremity pulses are 2+ & equal. No JVD. Abdomen: Bowel sounds are present, abdomen soft and non-tender without masses or  hernias noted. Msk: Normal strength and tone for age. Extremities: No clubbing, cyanosis or edema.    Skin:  No rashes or lesions noted. Neuro: Alert and oriented X 3. Psych:  Good affect, responds appropriately   Labs & Radiologic Studies  CBC Recent Labs    08/12/24 1659 08/14/24 0335  WBC 7.5 7.1  HGB 12.4* 12.3*  HCT 37.3* 35.7*  MCV 98.7 94.7  PLT 173 160   Basic Metabolic Panel Recent Labs    87/73/74 1659  NA 134*  K 4.6  CL 102   CO2 21*  GLUCOSE 114*  BUN 23  CREATININE 0.88  CALCIUM  9.8   Liver Function Tests No results for input(s): AST, ALT, ALKPHOS, BILITOT, PROT, ALBUMIN in the last 72 hours. No results for input(s): LIPASE, AMYLASE in the last 72 hours. High Sensitivity Troponin:   No results for input(s): TROPONINIHS in the last 720 hours.  Recent Labs  Lab 08/12/24 1659 08/12/24 2127  TRNPT 36* 33*    BNP Invalid input(s): POCBNP No results for input(s): PROBNP in the last 72 hours.  No results for input(s): BNP in the last 72 hours.  D-Dimer No results for input(s): DDIMER in the last 72 hours. Hemoglobin A1C No results for input(s): HGBA1C in the last 72 hours. Fasting Lipid Panel No results for input(s): CHOL,  HDL, LDLCALC, TRIG, CHOLHDL, LDLDIRECT in the last 72 hours. No results found for: LIPOA  Thyroid  Function Tests No results for input(s): TSH, T4TOTAL, T3FREE, THYROIDAB in the last 72 hours.  Invalid input(s): FREET3 _____________  DG Chest 2 View Result Date: 08/12/2024 EXAM: 2 VIEW(S) XRAY OF THE CHEST 08/12/2024 06:04:00 PM COMPARISON: None available. CLINICAL HISTORY: pacemaker problem FINDINGS: LINES, TUBES AND DEVICES: Left chest dual lead pacing system with wire tips in the region of the right atrium and right ventricle. Additional disconnected pacemaker lead. LUNGS AND PLEURA: No focal pulmonary opacity. No pleural effusion. No pneumothorax. HEART AND MEDIASTINUM: Mild cardiomegaly. Calcification of transverse aorta. BONES AND SOFT TISSUES: Thoracic spondylosis. No acute osseous abnormality. IMPRESSION: 1. Left chest dual lead pacing system with wire tips projecting in the region of the right atrium and right ventricle, with an additional disconnected pacemaker lead. 2. Mild cardiomegaly. Electronically signed by: Dorethia Molt MD 08/12/2024 07:16 PM EST RP Workstation: [id number]   CUP PACEART INCLINIC DEVICE CHECK Result  Date: 08/12/2024 in-clinic _dual__ chamber pacemaker check. Presenting Rhythm: __AFlutter/VP_ . Routine testing of thresholds, sensing, and impedance demonstrate stable parameters (appears chronically lower impedances on both leads, slight trending downwards). 100% AFlutter. Estimated longevity _3 years___ . Pt enrolled in remote follow-up. NOISE noted on both leads (known for him) TODAY is reproducible on RV lead that inhibits pacing programmed VOO see OV note for full discussion RU   Disposition Pt is being discharged home today in good condition.  Follow-up Plans & Appointments  Discharge Instructions     Call MD for:  persistant dizziness or light-headedness   Complete by: As directed    Diet Carb Modified   Complete by: As directed    Increase activity slowly   Complete by: As directed        Discharge Medications Allergies as of 08/14/2024   No Known Allergies      Medication List     TAKE these medications    acetaminophen  325 MG tablet Commonly known as: TYLENOL  Take 325 mg by mouth every 6 (six) hours as needed for moderate pain (pain score 4-6) or headache.   allopurinol  300 MG tablet Commonly known as: ZYLOPRIM  Take 300 mg by mouth daily.   carvedilol  25 MG tablet Commonly known as: COREG  Take 1.5 tablets (37.5 mg total) by mouth 2 (two) times daily with a meal.   dorzolamide -timolol  2-0.5 % ophthalmic solution Commonly known as: COSOPT  Place 1 drop into both eyes 2 (two) times daily.   Eliquis  5 MG Tabs tablet Generic drug: apixaban  TAKE 1 TABLET(5 MG) BY MOUTH TWICE DAILY   hydrochlorothiazide  12.5 MG tablet Commonly known as: HYDRODIURIL  Take 12.5 mg by mouth daily.   hydrochlorothiazide  25 MG tablet Commonly known as: HYDRODIURIL  TAKE 1 TABLET(25 MG) BY MOUTH DAILY   Janumet 50-1000 MG tablet Generic drug: sitaGLIPtin-metformin Take 1 tablet by mouth 2 (two) times daily.   rosuvastatin  20 MG tablet Commonly known as: CRESTOR  Take 20 mg  by mouth daily.         Outstanding Labs/Studies None  Duration of Discharge Encounter: APP Time: 42 minutes   Signed, Shona Shad, PA-C 08/14/2024, 10:27 AM     "

## 2024-08-16 ENCOUNTER — Ambulatory Visit: Payer: Self-pay | Admitting: Internal Medicine

## 2024-08-17 NOTE — Progress Notes (Signed)
 Remote PPM Transmission

## 2024-09-08 ENCOUNTER — Other Ambulatory Visit: Payer: Self-pay | Admitting: Internal Medicine

## 2024-09-08 DIAGNOSIS — I480 Paroxysmal atrial fibrillation: Secondary | ICD-10-CM
